# Patient Record
Sex: Male | Born: 1935 | Race: White | Hispanic: No | Marital: Married | Smoking: Never smoker
Health system: Southern US, Community
[De-identification: ages and names within clinical notes are randomized; demographics above are authoritative.]

## PROBLEM LIST (undated history)

## (undated) DIAGNOSIS — N529 Male erectile dysfunction, unspecified: Secondary | ICD-10-CM

## (undated) DIAGNOSIS — H269 Unspecified cataract: Secondary | ICD-10-CM

## (undated) DIAGNOSIS — R7302 Impaired glucose tolerance (oral): Secondary | ICD-10-CM

## (undated) DIAGNOSIS — G43909 Migraine, unspecified, not intractable, without status migrainosus: Secondary | ICD-10-CM

## (undated) DIAGNOSIS — K635 Polyp of colon: Secondary | ICD-10-CM

## (undated) DIAGNOSIS — I4510 Unspecified right bundle-branch block: Secondary | ICD-10-CM

## (undated) DIAGNOSIS — E039 Hypothyroidism, unspecified: Secondary | ICD-10-CM

## (undated) DIAGNOSIS — N200 Calculus of kidney: Secondary | ICD-10-CM

## (undated) DIAGNOSIS — R972 Elevated prostate specific antigen [PSA]: Secondary | ICD-10-CM

## (undated) HISTORY — DX: Calculus of kidney: N20.0

## (undated) HISTORY — DX: Impaired glucose tolerance (oral): R73.02

## (undated) HISTORY — DX: Elevated prostate specific antigen (PSA): R97.20

## (undated) HISTORY — DX: Unspecified right bundle-branch block: I45.10

## (undated) HISTORY — DX: Migraine, unspecified, not intractable, without status migrainosus: G43.909

## (undated) HISTORY — DX: Polyp of colon: K63.5

## (undated) HISTORY — PX: REFRACTIVE SURGERY: SHX103

## (undated) HISTORY — DX: Hypothyroidism, unspecified: E03.9

## (undated) HISTORY — PX: OTHER SURGICAL HISTORY: SHX169

## (undated) HISTORY — DX: Unspecified cataract: H26.9

## (undated) HISTORY — DX: Male erectile dysfunction, unspecified: N52.9

---

## 2005-02-18 ENCOUNTER — Encounter: Admission: RE | Admit: 2005-02-18 | Discharge: 2005-02-18 | Payer: Self-pay | Admitting: Internal Medicine

## 2006-12-22 HISTORY — PX: EYE SURGERY: SHX253

## 2009-12-03 ENCOUNTER — Encounter (INDEPENDENT_AMBULATORY_CARE_PROVIDER_SITE_OTHER): Payer: Self-pay | Admitting: *Deleted

## 2010-01-14 ENCOUNTER — Ambulatory Visit: Payer: Self-pay | Admitting: Internal Medicine

## 2010-01-14 DIAGNOSIS — K648 Other hemorrhoids: Secondary | ICD-10-CM | POA: Insufficient documentation

## 2010-01-14 DIAGNOSIS — D500 Iron deficiency anemia secondary to blood loss (chronic): Secondary | ICD-10-CM | POA: Insufficient documentation

## 2010-01-28 ENCOUNTER — Ambulatory Visit: Payer: Self-pay | Admitting: Internal Medicine

## 2010-01-29 ENCOUNTER — Encounter: Payer: Self-pay | Admitting: Internal Medicine

## 2010-01-30 ENCOUNTER — Telehealth: Payer: Self-pay | Admitting: Internal Medicine

## 2010-01-30 ENCOUNTER — Emergency Department (HOSPITAL_COMMUNITY): Admission: EM | Admit: 2010-01-30 | Discharge: 2010-01-30 | Payer: Self-pay | Admitting: Emergency Medicine

## 2010-02-26 ENCOUNTER — Encounter: Payer: Self-pay | Admitting: Internal Medicine

## 2010-08-29 ENCOUNTER — Encounter: Payer: Self-pay | Admitting: Internal Medicine

## 2011-01-21 NOTE — Letter (Signed)
Summary: [facility] Instructions  [facility]  [address] [place], [place] [address]   Phone: [phone]  Fax: [phone]       GERIK COBERLY    02-25-1935    MRN: [id number]        Procedure Day /Date:MONDAY, 01/28/10     Arrival Time:7:30 AM     Procedure Time:8:30 AM     Location of Procedure:                    X  [facility] (4th Floor)   PREPARATION FOR COLONOSCOPY WITH MOVIPREP   Starting 5 days prior to your procedure 2/2/11do not eat nuts, seeds, popcorn, corn, beans, peas,  salads, or any raw vegetables.  Do not take any fiber supplements (e.g. Metamucil, Citrucel, and Benefiber).  THE DAY BEFORE YOUR PROCEDURE         DATE:01/27/10  ONG:EXBMWU  1.  Drink clear liquids the entire day-NO SOLID FOOD  2.  Do not drink anything colored red or purple.  Avoid juices with pulp.  No orange juice.  3.  Drink at least 64 oz. (8 glasses) of fluid/clear liquids during the day to prevent dehydration and help the prep work efficiently.  CLEAR LIQUIDS INCLUDE: Water Jello Ice Popsicles Tea (sugar ok, no milk/cream) Powdered fruit flavored drinks Coffee (sugar ok, no milk/cream) Gatorade Juice: apple, white grape, white cranberry  Lemonade Clear bullion, consomm, broth Carbonated beverages (any kind) Strained chicken noodle soup Hard Candy                             4.  In the morning, mix first dose of MoviPrep solution:    Empty 1 Pouch A and 1 Pouch B into the disposable container    Add lukewarm drinking water to the top line of the container. Mix to dissolve    Refrigerate (mixed solution should be used within 24 hrs)  5.  Begin drinking the prep at 5:00 p.m. The MoviPrep container is divided by 4 marks.   Every 15 minutes drink the solution down to the next mark (approximately 8 oz) until the full liter is complete.   6.  Follow completed prep with 16 oz of clear liquid of your choice (Nothing red or purple).  Continue to drink  clear liquids until bedtime.  7.  Before going to bed, mix second dose of MoviPrep solution:    Empty 1 Pouch A and 1 Pouch B into the disposable container    Add lukewarm drinking water to the top line of the container. Mix to dissolve    Refrigerate  THE DAY OF YOUR PROCEDURE      DATE: 01/28/10 DAY: MONDAY  Beginning at 3:30 a.m. (5 hours before procedure):         1. Every 15 minutes, drink the solution down to the next mark (approx 8 oz) until the full liter is complete.  2. Follow completed prep with 16 oz. of clear liquid of your choice.    3. You may drink clear liquids until 6:30 AM (2 HOURS BEFORE PROCEDURE).   MEDICATION INSTRUCTIONS  Unless otherwise instructed, you should take regular prescription medications with a small sip of water   as early as possible the morning of your procedure.             OTHER INSTRUCTIONS  You will need a responsible adult at least 74 years of  age to accompany you and drive you home.   This person must remain in the waiting room during your procedure.  Wear loose fitting clothing that is easily removed.  Leave jewelry and other valuables at home.  However, you may wish to bring a book to read or  an iPod/MP3 player to listen to music as you wait for your procedure to start.  Remove all body piercing jewelry and leave at home.  Total time from sign-in until discharge is approximately 2-3 hours.  You should go home directly after your procedure and rest.  You can resume normal activities the  day after your procedure.  The day of your procedure you should not:   Drive   Make legal decisions   Operate machinery   Drink alcohol   Return to work  You will receive specific instructions about eating, activities and medications before you leave.    The above instructions have been reviewed and explained to me by   _______________________    I fully understand and can verbalize these instructions  _____________________________ Date _________

## 2011-01-21 NOTE — Progress Notes (Signed)
Summary: Triage / had a kidney stone  Phone Note Call from Patient Call back at [phone] Cell   Caller: Patient Call For: Dr. Marina Goodell Reason for Call: Talk to Nurse Summary of Call: Pt had a colonoscopy on 01-28-10 and called Dr. Arlyce Dice before the office opened this morning. Was told to go to ER because he had alot of abdominal pain and bloating. Pt. went to ER and is now feeling better. Wanted to give Dr. Marina Goodell an update on his condition. Initial call taken by: Karna Christmas,  January 30, 2010 3:02 PM  Follow-up for Phone Call         Pt. says they found a kidney stone  and he is getting ready to go home from hospital but has not yet passed the stone.Says he has a hx. of kidney stones. Follow-up by: Teryl Lucy RN,  January 30, 2010 3:24 PM  Additional Follow-up for Phone Call Additional follow up Details #1::         I appreciate the update. Good luck with the kidney stone Additional Follow-up by: Hilarie Fredrickson MD,  February 01, 2010 9:46 AM

## 2011-01-21 NOTE — Letter (Signed)
Summary: [facility]  [facility]   Imported By: Lester Cordova 09/04/2010 11:42:45  _____________________________________________________________________  External Attachment:    Type:   Image     Comment:   External Document

## 2011-01-21 NOTE — Letter (Signed)
Summary: [facility]  [facility]   Imported By: Sherian Rein 03/04/2010 07:46:50  _____________________________________________________________________  External Attachment:    Type:   Image     Comment:   External Document

## 2011-01-21 NOTE — Letter (Signed)
Summary: Patient Notice- Polyp Results  [facility]  [address] [place], [place] [address]   Phone: [phone]  Fax: [phone]        January 29, 2010 MRN: [id number]    SHADI SESSLER [address] [place], [place]  [address]    Dear Mr. Hamon,  I am pleased to inform you that the colon polyp(s) removed during your recent colonoscopy was (were) found to be benign (no cancer detected) upon pathologic examination.  I recommend you have a repeat colonoscopy examination in 5 years to look for recurrent polyps, as having colon polyps increases your risk for having recurrent polyps or even colon cancer in the future.  Should you develop new or worsening symptoms of abdominal pain, bowel habit changes or bleeding from the rectum or bowels, please schedule an evaluation with either your primary care physician or with me.  Additional information/recommendations:  __ No further action with gastroenterology is needed at this time. Please      follow-up with your primary care physician for your other healthcare      needs.  Please call us if you are having persistent problems or have questions about your condition that have not been fully answered at this time.  Sincerely,  Hilarie Fredrickson MD  This letter has been electronically signed by your physician.  Appended Document: Patient Notice- Polyp Results Letter mailed 2.10.11

## 2011-01-21 NOTE — Procedures (Signed)
Summary: Colonoscopy  Patient: Jeremy Cortez Note: All result statuses are Final unless otherwise noted.  Tests: (1) Colonoscopy (COL)   COL Colonoscopy           DONE     [facility]     [address]     [place], [place]  [address]           COLONOSCOPY PROCEDURE REPORT           PATIENT:  Jeremy, Cortez  MR#:  [id number]     BIRTHDATE:  14-Apr-1935, 74 yrs. old  GENDER:  male           ENDOSCOPIST:  Wilhemina Bonito. Eda Keys, MD     Referred by:  Chilton Greathouse, M.D.           PROCEDURE DATE:  01/28/2010     PROCEDURE:  Colonoscopy with snare polypectomy     ASA CLASS:  Class II     INDICATIONS:  Routine Risk Screening           MEDICATIONS:   Fentanyl 50 mcg IV, Versed 5 mg IV           DESCRIPTION OF PROCEDURE:   After the risks benefits and     alternatives of the procedure were thoroughly explained, informed     consent was obtained.  Digital rectal exam was performed and     revealed no abnormalities.   The LB CF-H180AL [id number] endoscope     was introduced through the anus and advanced to the cecum, which     was identified by both the appendix and ileocecal valve, without     limitations.Time to cecum = 3:09 min. The quality of the prep was     excellent, using MoviPrep.  The instrument was then slowly     withdrawn (time = 10:57min) as the colon was fully examined.     <<PROCEDUREIMAGES>>           FINDINGS:  A 6mm sessile polyp was found in the cecum. Polyp was     snared without cautery. Retrieval was successful.   Moderate     diverticulosis was found throughout the colon.   Retroflexed views     in the rectum revealed no abnormalities.    The scope was then     withdrawn from the patient and the procedure completed.           COMPLICATIONS:  None           ENDOSCOPIC IMPRESSION:     1) Sessile polyp in the cecum - removed     2) Moderate diverticulosis throughout the colon           RECOMMENDATIONS:     1) Follow up colonoscopy in 5 years        ______________________________     Wilhemina Bonito. Eda Keys, MD           CC:  Chilton Greathouse, MD; the Patient           n.     eSIGNED:   Wilhemina Bonito. Eda Keys at 01/28/2010 09:26 AM           Jonell Cluck, [id number]  Note: An exclamation mark (!) indicates a result that was not dispersed into the flowsheet. Document Creation Date: 01/28/2010 9:26 AM _______________________________________________________________________  (1) Order result status: Final Collection or observation date-time: 01/28/2010 09:19 Requested date-time:  Receipt date-time:  Reported date-time:  Referring Physician:   Ordering  Physician: Fransico Setters [id number]) Specimen Source:  Source: Launa Grill Order Number: [id number] Lab site:   Appended Document: Colonoscopy 5 yr recall     Procedures Next Due Date:    Colonoscopy: 01/2015

## 2011-01-21 NOTE — Assessment & Plan Note (Signed)
Summary: ANEMIC/CH.   History of Present Illness Visit Type: Initial Consult Primary GI MD: Yancey Flemings MD Primary Provider: Lilli Few Requesting Provider: Lilli Few, MD Chief Complaint: Anemia History of Present Illness:   76 year old white male with hypothyroidism, nephrolithiasis, benign prostatic hypertrophy, and impaired glucose tolerance. He is sent today regarding anemia and colonoscopy. The patient tells me that he was diagnosed with "low blood count" last summer. Tells me that he was diagnosed with hypothyroidism and placed on TSH. I have reviewed outside blood work from Dr. Felipa Eth. On November 01, 2009, the hemoglobin was normal at 15.7. Comprehensive metabolic panel was normal as was his thyroid-stimulating hormone and TSH. His hemoglobin A1c was 6.1% . Patient denies any GI symptoms other than occasional problems with hemorrhoids. He has not had prior screening colonoscopy. No family history of colon cancer. Hemoccult studies from November were negative.   GI Review of Systems      Denies abdominal pain, acid reflux, belching, bloating, chest pain, dysphagia with liquids, dysphagia with solids, heartburn, loss of appetite, nausea, vomiting, vomiting blood, weight loss, and  weight gain.      Reports hemorrhoids.     Denies anal fissure, black tarry stools, change in bowel habit, constipation, diarrhea, diverticulosis, fecal incontinence, heme positive stool, irritable bowel syndrome, jaundice, light color stool, liver problems, rectal bleeding, and  rectal pain.    Current Medications (verified): 1)  Synthroid 50 Mcg Tabs (Levothyroxine Sodium) .... Once Daily 2)  Aspir-Low 81 Mg Tbec (Aspirin) .... Once Daily 3)  Fish Oil 1000 Mg Caps (Omega-3 Fatty Acids) .... Two Times A Day 4)  Lecithin 1200 Mg Caps (Lecithin) .... Once Daily 5)  Saw Palmetto 500 Mg Caps (Saw Palmetto (Serenoa Repens)) .... Once Daily  Allergies (verified): No Known Drug Allergies  Past History:  Past  Medical History: Reviewed history from 01/10/2010 and no changes required. BPH Diabetes Hypothyroidism Kidney Stones Chronic Low Back Pain  Past Surgical History: Reviewed history from 01/10/2010 and no changes required. Eye Surgery  Family History: MS-Father BPH Prostate Cancer-brother Family History of Heart Disease: Both Parents No FH of Colon Cancer:  Social History: Reviewed history from 01/10/2010 and no changes required. Married 2 daughters  1 son Retired Patient has never smoked.  Alcohol Use - no Illicit Drug Use - no  Review of Systems       back pain and hearing problems. All other systems reviewed and reported as negative.  Vital Signs:  Patient profile:   76 year old male Height:      71 inches Weight:      195.38 pounds BMI:     27.35 Pulse rate:   76 / minute Pulse rhythm:   regular BP sitting:   110 / 70  (left arm) Cuff size:   regular  Vitals Entered By: June McMurray CMA Duncan Dull) (January 14, 2010 9:25 AM)  Physical Exam  General:  Well developed, well nourished, no acute distress. Head:  Normocephalic and atraumatic. Eyes:  PERRLA, no icterus. Nose:  No deformity, discharge,  or lesions. Mouth:  No deformity or lesions, . Neck:  Supple; no masses or thyromegaly. Lungs:  Clear throughout to auscultation. Heart:  Regular rate and rhythm; no murmurs, rubs,  or bruits. Abdomen:  Soft, nontender and nondistended. No masses, hepatosplenomegaly or hernias noted. Normal bowel sounds. Rectal:  deferred until colonoscopy Prostate:  deferred Msk:  Symmetrical with no gross deformities. Normal posture. Pulses:  Normal pulses noted. Extremities:  No clubbing, cyanosis, edema  or deformities noted. Neurologic:  Alert and  oriented x4;  grossly normal neurologically. Skin:  Intact without significant lesions or rashes. Psych:  Alert and cooperative. Normal mood and affect.   Impression & Recommendations:  Problem # 1:   ANEMIA   (ICD-280.0) Question of anemia. No documentation of such. Most recent hemoglobin normal. Hemoccult negative stool.  Plan: #1. Submit request for additional outside laboratories, if any, substantiating anemia.  Problem # 2:  INTERNAL HEMORRHOIDS WITHOUT MENTION COMP (ICD-455.0) abdomen problems with hemorrhoids. Currently not problematic.  Plan: #1. Hemorrhoid care instructions sheet provided  Problem # 3:  SCREENING COLORECTAL-CANCER (ICD-V76.51) the patient is an appropriate candidate for screening colonoscopy without contraindication. The nature procedure as well as risks, benefits, and alternatives were reviewed. He understood and agreed to proceed. Movi prep prescribed. The patient instructed on its use  Other Orders: Colonoscopy (Colon)  Patient Instructions: 1)  Colonoscopy [facility] 01/28/10 8:30 am 2)  Movi prep instructions given to patient. 3)  Movi prep Rx. sent to pharmacy. 4)  Colonoscopy and Flexible Sigmoidoscopy brochure given.  5)  Upper Endoscopy brochure given.  6)  The medication list was reviewed and reconciled.  All changed / newly prescribed medications were explained.  A complete medication list was provided to the patient / caregiver. 7)  Copy: Dr. Larina Earthly Prescriptions: MOVIPREP 100 GM  SOLR (PEG-KCL-NACL-NASULF-NA ASC-C) As per prep instructions.  #1 x 0   Entered by:   Milford Cage NCMA   Authorized by:   Hilarie Fredrickson MD   Signed by:   Milford Cage NCMA on 01/14/2010   Method used:   Electronically to        [facility] [phone]* (retail)       [address]       [place], [place]  [id number]       Ph: [phone] or [phone]       Fax: [phone]   RxID:   [id number]

## 2011-03-13 LAB — CBC
Hemoglobin: 16 g/dL (ref 13.0–17.0)
MCHC: 34.6 g/dL (ref 30.0–36.0)
MCV: 95.6 fL (ref 78.0–100.0)
RDW: 12.8 % (ref 11.5–15.5)

## 2011-03-13 LAB — DIFFERENTIAL
Lymphocytes Relative: 7 % — ABNORMAL LOW (ref 12–46)
Lymphs Abs: 0.8 10*3/uL (ref 0.7–4.0)
Monocytes Relative: 3 % (ref 3–12)
Neutro Abs: 10.5 10*3/uL — ABNORMAL HIGH (ref 1.7–7.7)
Neutrophils Relative %: 90 % — ABNORMAL HIGH (ref 43–77)

## 2011-03-13 LAB — URINALYSIS, ROUTINE W REFLEX MICROSCOPIC
Bilirubin Urine: NEGATIVE
Nitrite: NEGATIVE
Urobilinogen, UA: 0.2 mg/dL (ref 0.0–1.0)
pH: 5.5 (ref 5.0–8.0)

## 2011-03-13 LAB — COMPREHENSIVE METABOLIC PANEL
BUN: 15 mg/dL (ref 6–23)
Calcium: 9.6 mg/dL (ref 8.4–10.5)
Creatinine, Ser: 1.41 mg/dL (ref 0.4–1.5)
GFR calc non Af Amer: 49 mL/min — ABNORMAL LOW (ref >60)
Glucose, Bld: 187 mg/dL — ABNORMAL HIGH (ref 70–99)
Sodium: 141 mEq/L (ref 135–145)
Total Protein: 6.6 g/dL (ref 6.0–8.3)

## 2011-03-13 LAB — LIPASE, BLOOD: Lipase: 21 U/L (ref 11–59)

## 2011-03-13 LAB — URINE MICROSCOPIC-ADD ON

## 2011-03-13 LAB — URINE CULTURE

## 2011-03-13 LAB — PROTIME-INR
INR: 1.1 (ref 0.00–1.49)
Prothrombin Time: 14.1 seconds (ref 11.6–15.2)

## 2011-03-13 LAB — LACTIC ACID, PLASMA: Lactic Acid, Venous: 2 mmol/L (ref 0.5–2.2)

## 2011-05-06 NOTE — Assessment & Plan Note (Signed)
[facility]                                 ON-CALL NOTE   [name], [name]                        MRN:          [id number]  DATE:01/30/2010                            DOB:          1935-04-02    [name] called approximately 4 hours ago complaining of abdominal  pain and bloating.  He had the inability to pass gas and felt like these  were gas pains.  He had a colonoscopy 2 days before.  He felt perfectly  well until this time.  He was instructed to try to walk about and to  call back if his symptoms did not rapidly subside.  I received a second  call several minutes ago stating that he continues to be bloated and has  abdominal discomfort.  He was instructed to go to the emergency room for  evaluation.     Barbette Hair. Arlyce Dice, MD,FACG     RDK/MedQ  DD: 01/30/2010  DT: 01/30/2010  Job #: [id number]   cc:   Wilhemina Bonito. Marina Goodell, MD

## 2012-12-02 ENCOUNTER — Ambulatory Visit (HOSPITAL_COMMUNITY): Payer: Medicare Other | Attending: Internal Medicine | Admitting: Radiology

## 2012-12-02 ENCOUNTER — Other Ambulatory Visit (HOSPITAL_COMMUNITY): Payer: Self-pay | Admitting: Radiology

## 2012-12-02 DIAGNOSIS — I4510 Unspecified right bundle-branch block: Secondary | ICD-10-CM

## 2012-12-02 DIAGNOSIS — I452 Bifascicular block: Secondary | ICD-10-CM | POA: Insufficient documentation

## 2012-12-02 DIAGNOSIS — I359 Nonrheumatic aortic valve disorder, unspecified: Secondary | ICD-10-CM

## 2012-12-02 DIAGNOSIS — I080 Rheumatic disorders of both mitral and aortic valves: Secondary | ICD-10-CM | POA: Insufficient documentation

## 2012-12-02 DIAGNOSIS — I369 Nonrheumatic tricuspid valve disorder, unspecified: Secondary | ICD-10-CM | POA: Insufficient documentation

## 2012-12-02 NOTE — Progress Notes (Signed)
Echocardiogram performed.  

## 2012-12-03 ENCOUNTER — Encounter (HOSPITAL_COMMUNITY): Payer: Self-pay | Admitting: Internal Medicine

## 2013-01-13 ENCOUNTER — Ambulatory Visit (INDEPENDENT_AMBULATORY_CARE_PROVIDER_SITE_OTHER): Payer: Medicare Other | Admitting: Internal Medicine

## 2013-01-13 ENCOUNTER — Encounter: Payer: Self-pay | Admitting: Internal Medicine

## 2013-01-13 VITALS — BP 116/68 | HR 70 | Ht 71.0 in | Wt 188.0 lb

## 2013-01-13 DIAGNOSIS — R1032 Left lower quadrant pain: Secondary | ICD-10-CM

## 2013-01-13 DIAGNOSIS — K5730 Diverticulosis of large intestine without perforation or abscess without bleeding: Secondary | ICD-10-CM

## 2013-01-13 DIAGNOSIS — Z86010 Personal history of colonic polyps: Secondary | ICD-10-CM

## 2013-01-13 DIAGNOSIS — R195 Other fecal abnormalities: Secondary | ICD-10-CM

## 2013-01-13 MED ORDER — MOVIPREP 100 G PO SOLR: 1.0000 | Freq: Once | ORAL | Status: DC

## 2013-01-13 NOTE — Patient Instructions (Addendum)
You have been scheduled for a endoscopy and colonoscopy with propofol. Please follow written instructions given to you at your visit today.  Please pick up your prep kit at the pharmacy within the next 1-3 days. If you use inhalers (even only as needed) or a CPAP machine, please bring them with you on the day of your procedure.

## 2013-01-13 NOTE — Progress Notes (Signed)
HISTORY OF PRESENT ILLNESS:  Jeremy Cortez is a 78 y.o. male with multiple medical problems as listed below. Patient is referred today regarding Hemoccult-positive stool. He was last seen in the office January 2011 for undocumented anemia with Hemoccult negative stool and the need for colonoscopy. He had not had prior colonoscopy screening. The examination was performed 01/28/2010. He was found to have a 6 mm sessile cecal polyp which was removed and found to be adenomatous. As well moderate pandiverticulosis. Routine followup in 5 years recommended. The patient underwent annual evaluation with his primary provider, Dr. Felipa Eth, last month. Hemoccult testing returned positive. His hemoglobin was normal at 16.4. Repeat Hemoccult study 2 weeks ago was again positive. Patient has been using NSAIDs for back pain, in addition to his daily aspirin. He denies melena or hematochezia. His GI review of systems is however remarkable for 3 weeks of left lower quadrant pain in December. No fever. No change in bowel habits. No problems for the past 7-10 days. He thinks that the discomfort is exacerbated by meds. Cannot identify any factor is that either relieved or exacerbated the pain. He has had this intermittently over the years  REVIEW OF SYSTEMS:  All non-GI ROS negative except for back pain and cough  Past Medical History  Diagnosis Date  . Elevated prostate specific antigen (PSA)   . Impaired glucose tolerance   . Migraine   . Nephrolithiasis   . Erectile dysfunction   . Abnormal PSA   . Bundle branch block, right   . Colon polyps     adenomatous  . Hypothyroidism   . Cataract     Past Surgical History  Procedure Date  . Eye surgery 2008  . Cataract surgery   . Refractive surgery     01/13/2013    Social History [name]  reports that he has never smoked. He has never used smokeless tobacco. He reports that he drinks alcohol. He reports that he does not use illicit drugs.  family  history includes Benign prostatic hyperplasia in his brother; CAD in his brother; Muscular dystrophy in his father; Peripheral vascular disease in his brother; Prostate cancer in his brother; and Ulcers in his father.  No Known Allergies     PHYSICAL EXAMINATION: Vital signs: BP 116/68  Pulse 70  Ht 5\' 11"  (1.803 m)  Wt 188 lb (85.276 kg)  BMI 26.22 kg/m2  Constitutional: generally well-appearing, no acute distress Psychiatric: alert and oriented x3, cooperative Eyes: extraocular movements intact, anicteric, conjunctiva pink Mouth: oral pharynx moist, no lesions Neck: supple no lymphadenopathy Cardiovascular: heart regular rate and rhythm, no murmur Lungs: clear to auscultation bilaterally Abdomen: soft, nontender, nondistended, no obvious ascites, no peritoneal signs, normal bowel sounds, no organomegaly Rectal: Deferred until colonoscopy Extremities: no lower extremity edema bilaterally Skin: no lesions on visible extremities Neuro: No focal deficits.   ASSESSMENT:  #1. Hemoccult-positive stool. Normal hemoglobin. Recent NSAID use. Chronic aspirin use #2. Recent problems with left lower quadrant discomfort. Question diverticulitis. Currently asymptomatic benign exam #3. History of adenomatous colon polyps 3 years ago   PLAN:  #1. Colonoscopy.The nature of the procedure, as well as the risks, benefits, and alternatives were carefully and thoroughly reviewed with the patient. Ample time for discussion and questions allowed. The patient understood, was satisfied, and agreed to proceed.  #2. Diagnostic upper endoscopy.The nature of the procedure, as well as the risks, benefits, and alternatives were carefully and thoroughly reviewed with the patient. Ample time for discussion and  questions allowed. The patient understood, was satisfied, and agreed to proceed.  #3. Avoid NSAIDs #4. Movi prep prescribed #5. Ongoing general medical care with Dr. Felipa Eth

## 2013-01-28 ENCOUNTER — Encounter: Payer: Self-pay | Admitting: Internal Medicine

## 2013-01-28 ENCOUNTER — Ambulatory Visit (AMBULATORY_SURGERY_CENTER): Payer: Medicare Other | Admitting: Internal Medicine

## 2013-01-28 VITALS — BP 135/88 | HR 63 | Temp 98.4°F | Resp 14 | Ht 71.0 in | Wt 188.0 lb

## 2013-01-28 DIAGNOSIS — D126 Benign neoplasm of colon, unspecified: Secondary | ICD-10-CM

## 2013-01-28 DIAGNOSIS — K635 Polyp of colon: Secondary | ICD-10-CM

## 2013-01-28 DIAGNOSIS — Z1211 Encounter for screening for malignant neoplasm of colon: Secondary | ICD-10-CM

## 2013-01-28 DIAGNOSIS — Z860100 Personal history of colon polyps, unspecified: Secondary | ICD-10-CM

## 2013-01-28 DIAGNOSIS — R195 Other fecal abnormalities: Secondary | ICD-10-CM

## 2013-01-28 DIAGNOSIS — K222 Esophageal obstruction: Secondary | ICD-10-CM

## 2013-01-28 DIAGNOSIS — K2980 Duodenitis without bleeding: Secondary | ICD-10-CM

## 2013-01-28 DIAGNOSIS — R1032 Left lower quadrant pain: Secondary | ICD-10-CM

## 2013-01-28 MED ORDER — SODIUM CHLORIDE 0.9 % IV SOLN: 500.0000 mL | INTRAVENOUS | Status: DC

## 2013-01-28 NOTE — Progress Notes (Signed)
Patient examined by Dr. Marina Goodell. Patient given coffee and ambulated to bathroom. Large amount of gas passed, less than 2/10 pain. Patient and wife agree with discharge.

## 2013-01-28 NOTE — Progress Notes (Signed)
Patient with complaints of gas discomfort since admission to Recovery. Patient turned side to side, levsin given and rectal tube inserted. Patient continues to complain of 4/10 with feeling of bloating with some nausea. Dr. Marina Goodell informed by Alease Frame RN, will check patient.

## 2013-01-28 NOTE — Progress Notes (Addendum)
Per pt- "When I lay back, I have a flap in the back of my nose that closes and I can't talk."  Finished drinking 16 oz of Sprite at 1300.  CRNA informed

## 2013-01-28 NOTE — Op Note (Signed)
[facility] [address] [place], [address]   COLONOSCOPY PROCEDURE REPORT  PATIENT: Jeremy Cortez, Jeremy Cortez  MR#: [id number] BIRTHDATE: 1935/09/13 , 77  yrs. old GENDER: Male ENDOSCOPIST: Roxy Cedar, MD REFERRED XB:JYNWGNFAOZ Avva, M.D. PROCEDURE DATE:  01/28/2013 PROCEDURE:   Colonoscopy with snare polypectomy  x 4 ASA CLASS:   Class II INDICATIONS:Patient's personal history of adenomatous colon polyps (01-2010 w/ TA) and heme-positive stool. MEDICATIONS: MAC sedation, administered by CRNA and propofol (Diprivan) 280mg  IV  DESCRIPTION OF PROCEDURE:   After the risks benefits and alternatives of the procedure were thoroughly explained, informed consent was obtained.  A digital rectal exam revealed no abnormalities of the rectum.   The LB CF-H180AL [id number]  endoscope was introduced through the anus and advanced to the cecum, which was identified by both the appendix and ileocecal valve. No adverse events experienced.   The quality of the prep was excellent, using MoviPrep  The instrument was then slowly withdrawn as the colon was fully examined.      COLON FINDINGS: Four polyps ranging between 3-87mm in size were found at the cecum, in the ascending colon, transverse colon, and rectum. A polypectomy was performed with a cold snare.  The resection was complete and the polyp tissue was completely retrieved.   Moderate diverticulosis was noted The finding was in the left colon.   The colon mucosa was otherwise normal.  Retroflexed views revealed internal hemorrhoids. The time to cecum=3 minutes 39 seconds. Withdrawal time=15 minutes 29 seconds.  The scope was withdrawn and the procedure completed. COMPLICATIONS: There were no complications.  ENDOSCOPIC IMPRESSION: 1.   Four polyps ranging between 3-49mm in size were found at the cecum, in the ascending colon, transverse colon, and rectum; polypectomy was performed with a cold snare 2.   Moderate  diverticulosis was noted in the left colon 3.   The colon mucosa was otherwise normal  RECOMMENDATIONS: 1. Repeat Colonoscopy in 3 years (if 3 or more adenomas).   eSigned:  Roxy Cedar, MD 01/28/2013 3:37 PM  cc: Chilton Greathouse, MD and The Patient   PATIENT NAME:  [name], [name] MR#: [id number]

## 2013-01-28 NOTE — Progress Notes (Signed)
Patient did not experience any of the following events: a burn prior to discharge; a fall within the facility; wrong site/side/patient/procedure/implant event; or a hospital transfer or hospital admission upon discharge from the facility. (G8907) Patient did not have preoperative order for IV antibiotic SSI prophylaxis. (G8918)  

## 2013-01-28 NOTE — Progress Notes (Signed)
Called to room to assist during endoscopic procedure.  Patient ID and intended procedure confirmed with present staff. Received instructions for my participation in the procedure from the performing physician.  

## 2013-01-28 NOTE — Op Note (Signed)
[facility] [address] [place], [address]   ENDOSCOPY PROCEDURE REPORT  PATIENT: Cartrell, Bentsen  MR#: [id number] BIRTHDATE: [birth year] , 77  yrs. old GENDER: Male ENDOSCOPIST: Roxy Cedar, MD REFERRED BY:  Chilton Greathouse, M.D. PROCEDURE DATE:  01/28/2013 PROCEDURE:  EGD w/ biopsy ASA CLASS:     Class II INDICATIONS:  Heme positive stool. MEDICATIONS: MAC sedation, administered by CRNA and propofol (Diprivan) 80mg  IV TOPICAL ANESTHETIC: Cetacaine Spray  DESCRIPTION OF PROCEDURE: After the risks benefits and alternatives of the procedure were thoroughly explained, informed consent was obtained.  The LB GIF-H180 [id number] endoscope was introduced through the mouth and advanced to the second portion of the duodenum. Without limitations.  The instrument was slowly withdrawn as the mucosa was fully examined.      The esophagus revealed a large caliber ring at the gastroesophageal junction.  No inflammation.  The stomach revealed a few antral erosions but was otherwise normal.  Retroflexed view revealed a moderate sized hiatal hernia.  Duodenal bulb revealed nonerosive duodenitis, which was mild.  The post bulbar duodenum was normal. CLO biopsy was taken.         The scope was then withdrawn from the patient and the procedure completed.  COMPLICATIONS: There were no complications. ENDOSCOPIC IMPRESSION: 1.  Incidental esophageal ring 2. Mild antral erosions and nonerosive duodenitis  RECOMMENDATIONS: 1.  Avoid unnecessary NSAIDS (anti-inflammatories) 2.  Rx CLO if positive 3. Return to the care of Dr. Felipa Eth  REPEAT EXAM:  eSigned:  Roxy Cedar, MD 01/28/2013 3:43 PM   MW:[id number] Avva, MD and The Patient

## 2013-01-28 NOTE — Patient Instructions (Addendum)
Avoid unnecessary NSAIDS(antiinflamatories-Motrin,Aleve,Ibuprofen etc)   YOU HAD AN ENDOSCOPIC PROCEDURE TODAY AT THE [facility]: Refer to the procedure report that was given to you for any specific questions about what was found during the examination.  If the procedure report does not answer your questions, please call your gastroenterologist to clarify.  If you requested that your care partner not be given the details of your procedure findings, then the procedure report has been included in a sealed envelope for you to review at your convenience later.  YOU SHOULD EXPECT: Some feelings of bloating in the abdomen. Passage of more gas than usual.  Walking can help get rid of the air that was put into your GI tract during the procedure and reduce the bloating. If you had a lower endoscopy (such as a colonoscopy or flexible sigmoidoscopy) you may notice spotting of blood in your stool or on the toilet paper. If you underwent a bowel prep for your procedure, then you may not have a normal bowel movement for a few days.  DIET: Your first meal following the procedure should be a light meal and then it is ok to progress to your normal diet.  A half-sandwich or bowl of soup is an example of a good first meal.  Heavy or fried foods are harder to digest and may make you feel nauseous or bloated.  Likewise meals heavy in dairy and vegetables can cause extra gas to form and this can also increase the bloating.  Drink plenty of fluids but you should avoid alcoholic beverages for 24 hours.  ACTIVITY: Your care partner should take you home directly after the procedure.  You should plan to take it easy, moving slowly for the rest of the day.  You can resume normal activity the day after the procedure however you should NOT DRIVE or use heavy machinery for 24 hours (because of the sedation medicines used during the test).    SYMPTOMS TO REPORT IMMEDIATELY: A gastroenterologist can be reached at any  hour.  During normal business hours, 8:30 AM to 5:00 PM Monday through Friday, call [phone].  After hours and on weekends, please call the GI answering service at [phone] who will take a message and have the physician on call contact you.   Following lower endoscopy (colonoscopy or flexible sigmoidoscopy):  Excessive amounts of blood in the stool  Significant tenderness or worsening of abdominal pains  Swelling of the abdomen that is new, acute  Fever of 100F or higher  Following upper endoscopy (EGD)  Vomiting of blood or coffee ground material  New chest pain or pain under the shoulder blades  Painful or persistently difficult swallowing  New shortness of breath  Fever of 100F or higher  Black, tarry-looking stools  FOLLOW UP: If any biopsies were taken you will be contacted by phone or by letter within the next 1-3 weeks.  Call your gastroenterologist if you have not heard about the biopsies in 3 weeks.  Our staff will call the home number listed on your records the next business day following your procedure to check on you and address any questions or concerns that you may have at that time regarding the information given to you following your procedure. This is a courtesy call and so if there is no answer at the home number and we have not heard from you through the emergency physician on call, we will assume that you have returned to your regular daily activities without incident.  SIGNATURES/CONFIDENTIALITY: You and/or your care partner have signed paperwork which will be entered into your electronic medical record.  These signatures attest to the fact that that the information above on your After Visit Summary has been reviewed and is understood.  Full responsibility of the confidentiality of this discharge information lies with you and/or your care-partner.

## 2013-01-31 ENCOUNTER — Telehealth: Payer: Self-pay | Admitting: *Deleted

## 2013-01-31 NOTE — Telephone Encounter (Signed)
  Follow up Call-  Call back number 01/28/2013  Post procedure Call Back phone  # [phone]  Permission to leave phone message Yes     Patient questions:  Do you have a fever, pain , or abdominal swelling? no Pain Score  0 *  Have you tolerated food without any problems? yes  Have you been able to return to your normal activities? yes  Do you have any questions about your discharge instructions: Diet   no Medications  no Follow up visit  no  Do you have questions or concerns about your Care? no  Actions: * If pain score is 4 or above: No action needed, pain <4.

## 2013-01-31 NOTE — Addendum Note (Signed)
Addended by: Darrel Hoover on: 01/31/2013 10:31 AM   Modules accepted: Orders

## 2013-02-01 ENCOUNTER — Encounter: Payer: Self-pay | Admitting: Internal Medicine

## 2015-10-26 ENCOUNTER — Encounter: Payer: Self-pay | Admitting: Podiatry

## 2015-10-26 ENCOUNTER — Ambulatory Visit (INDEPENDENT_AMBULATORY_CARE_PROVIDER_SITE_OTHER): Payer: Medicare Other

## 2015-10-26 ENCOUNTER — Ambulatory Visit (INDEPENDENT_AMBULATORY_CARE_PROVIDER_SITE_OTHER): Payer: Medicare Other | Admitting: Podiatry

## 2015-10-26 VITALS — BP 134/73 | HR 69 | Resp 12

## 2015-10-26 DIAGNOSIS — M779 Enthesopathy, unspecified: Secondary | ICD-10-CM | POA: Diagnosis not present

## 2015-10-26 DIAGNOSIS — S92302A Fracture of unspecified metatarsal bone(s), left foot, initial encounter for closed fracture: Secondary | ICD-10-CM

## 2015-10-26 NOTE — Progress Notes (Signed)
   Subjective:    Patient ID: Jeremy Cortez, male    DOB: [birth year], 80 y.o.   MRN: [id number]  HPI 80 year old male presents the office today with concerns of pain to the ball of his left foot for which he points along the second metatarsal head. This has been ongoing for the last 2 weeks it has been progressive. He states he has pain with rest and walk. He has been icing without any relief. He describes it as sharp pain. He denies any specific injury or trauma although he does state that he was walking more prior to the onset of pain. He has had on off pain in this area for the last couple years but seems to worsening quite a bit over the last 2 weeks. No other complaints at this time.   Review of Systems  HENT: Positive for hearing loss.        Objective:   Physical Exam General: AAO x3, NAD  Dermatological: Skin is warm, dry and supple bilateral. Nails x 10 are well manicured; remaining integument appears unremarkable at this time. There are no open sores, no preulcerative lesions, no rash or signs of infection present.  Vascular: Dorsalis Pedis artery and Posterior Tibial artery pedal pulses are 2/4 bilateral with immedate capillary fill time. Pedal hair growth present. No varicosities and no lower extremity edema present bilateral. There is no pain with calf compression, swelling, warmth, erythema.   Neruologic: Grossly intact via light touch bilateral. Vibratory intact via tuning fork bilateral. Protective threshold with Semmes Wienstein monofilament intact to all pedal sites bilateral. Patellar and Achilles deep tendon reflexes 2+ bilateral. No Babinski or clonus noted bilateral.   Musculoskeletal: There is tenderness to palpation along the second metatarsal head dorsally and plantarly. There is no pain with MPJ range of motion. No pain along the digits. There is faint edema overlying this area. There is no other areas of tenderness to bilateral lower extremities. There is mild  discomfort with pain within vibratory sensation over second metatarsal on the left foot. No gross boney pedal deformities bilateral. No pain, crepitus, or limitation noted with foot and ankle range of motion bilateral. Muscular strength 5/5 in all groups tested bilateral.  Gait: Unassisted, Nonantalgic.      Assessment & Plan:  80 year old male with possible left second metatarsal head fracture -X-rays were obtained and reviewed with the patient. There is a questionable area of radiolucency in the head of the second metatarsal. Given that this is where he has pinpoint pain and mild swelling recommend immobilization in a surgical shoe which is dispensed today. Ice elevation. Anti-inflammatories as needed. Compression sock. -Follow-up as scheduled or sooner if any problems arise. In the meantime, encouraged to call the office with any questions, concerns, change in symptoms.  *X-ray next appointment   Celesta Gentile

## 2015-10-29 ENCOUNTER — Encounter: Payer: Self-pay | Admitting: Podiatry

## 2015-11-13 ENCOUNTER — Ambulatory Visit (INDEPENDENT_AMBULATORY_CARE_PROVIDER_SITE_OTHER): Payer: Medicare Other | Admitting: Podiatry

## 2015-11-13 ENCOUNTER — Encounter: Payer: Self-pay | Admitting: Podiatry

## 2015-11-13 ENCOUNTER — Ambulatory Visit (INDEPENDENT_AMBULATORY_CARE_PROVIDER_SITE_OTHER): Payer: Medicare Other

## 2015-11-13 VITALS — BP 131/87 | HR 65 | Resp 18

## 2015-11-13 DIAGNOSIS — M779 Enthesopathy, unspecified: Secondary | ICD-10-CM

## 2015-11-13 DIAGNOSIS — R52 Pain, unspecified: Secondary | ICD-10-CM

## 2015-11-19 ENCOUNTER — Encounter: Payer: Self-pay | Admitting: Podiatry

## 2015-11-19 NOTE — Progress Notes (Signed)
Patient ID: Jeremy Cortez, male   DOB: [birth year], 80 y.o.   MRN: [id number]  Subjective: 80 year old male presents the office today for follow-up evaluation of left foot pain and for possible fracture the second metatarsal. He states that since last appointment his pain is significantly improved and he describes an occasional, throbbing pain after he stands for long periods of time but his pain is improved. He denies any swelling or redness. No tingling or numbness. No recent injury or trauma. No other complaints at this time.  Objective: General: AAO x3, NAD  Dermatological: Skin is warm, dry and supple bilateral. Nails x 10 are well manicured; remaining integument appears unremarkable at this time. There are no open sores, no preulcerative lesions, no rash or signs of infection present.  Vascular: Dorsalis Pedis artery and Posterior Tibial artery pedal pulses are 2/4 bilateral with immedate capillary fill time. Pedal hair growth present. No varicosities and no lower extremity edema present bilateral. There is no pain with calf compression, swelling, warmth, erythema.   Neruologic: Grossly intact via light touch bilateral. Vibratory intact via tuning fork bilateral. Protective threshold with Semmes Wienstein monofilament intact to all pedal sites bilateral.  Musculoskeletal: No gross boney pedal deformities bilateral. At this time there is no tenderness with patient and second metatarsal head dorsally or plantarly. There is no  MPJ range of motion. There is prominence the metatarsal heads and atrophy of the fat pad. There is no edema, erythema, increased warmth. There is no pain with vibratory sensation on the metatarsals or the areas of the foot. No pain, crepitus, or limitation noted with foot and ankle range of motion bilateral. Muscular strength 5/5 in all groups tested bilateral.  Gait: Unassisted, Nonantalgic.   Assessment: 80 year old male with resolving left foot pain, likely capsulitis  and fracture unlikely  Plan: -X-rays were obtained and reviewed with the patient. There is no definitive evidence of acute fracture or stress fracture identified this time. -Treatment options discussed including all alternatives, risks, and complications -Etiology of symptoms were discussed -Discussed steroid injection or NSAIDs to help with inflammation, but he states the pain is not bad and he wishes to hold off on this. Dispensed offloading pads. Discuss orthotics and shoe gear modifications. -Follow-up if symptoms recur or worsen. Call questions concerns in the meantime.  Celesta Gentile, DPM

## 2016-01-15 ENCOUNTER — Encounter: Payer: Self-pay | Admitting: Internal Medicine

## 2016-02-26 ENCOUNTER — Encounter: Payer: Self-pay | Admitting: Internal Medicine

## 2017-04-15 ENCOUNTER — Other Ambulatory Visit: Payer: Self-pay | Admitting: Internal Medicine

## 2017-04-15 DIAGNOSIS — R509 Fever, unspecified: Secondary | ICD-10-CM

## 2017-04-15 DIAGNOSIS — R61 Generalized hyperhidrosis: Secondary | ICD-10-CM

## 2017-04-15 DIAGNOSIS — R05 Cough: Secondary | ICD-10-CM

## 2017-04-15 DIAGNOSIS — R059 Cough, unspecified: Secondary | ICD-10-CM

## 2017-04-20 ENCOUNTER — Ambulatory Visit
Admission: RE | Admit: 2017-04-20 | Discharge: 2017-04-20 | Disposition: A | Discharge status: Home / Self Care | Payer: Medicare Other | Source: Ambulatory Visit | Attending: Internal Medicine | Admitting: Internal Medicine

## 2017-04-20 DIAGNOSIS — R059 Cough, unspecified: Secondary | ICD-10-CM

## 2017-04-20 DIAGNOSIS — R509 Fever, unspecified: Secondary | ICD-10-CM

## 2017-04-20 DIAGNOSIS — R61 Generalized hyperhidrosis: Secondary | ICD-10-CM

## 2017-04-20 DIAGNOSIS — R05 Cough: Secondary | ICD-10-CM

## 2017-04-20 MED ORDER — IOPAMIDOL (ISOVUE-300) INJECTION 61%
75.0000 mL | Freq: Once | INTRAVENOUS | Status: AC | PRN
  Administered 2017-04-20: 75 mL via INTRAVENOUS

## 2017-05-15 ENCOUNTER — Ambulatory Visit (INDEPENDENT_AMBULATORY_CARE_PROVIDER_SITE_OTHER): Payer: Medicare Other | Admitting: Pulmonary Disease

## 2017-05-15 ENCOUNTER — Encounter: Payer: Self-pay | Admitting: Pulmonary Disease

## 2017-05-15 VITALS — BP 122/70 | HR 57 | Ht 71.0 in | Wt 183.0 lb

## 2017-05-15 DIAGNOSIS — J189 Pneumonia, unspecified organism: Secondary | ICD-10-CM | POA: Diagnosis not present

## 2017-05-15 NOTE — Patient Instructions (Signed)
It was a pleasure taking care of you today!  You are diagnosed with pneumonia which is significantly improved.  We will get a chest CT scan middle of June.  Follow-up in the office after the chest CT scan (in 4 weeks)  With NP

## 2017-05-15 NOTE — Progress Notes (Signed)
Subjective:    Patient ID: Jeremy Cortez, male    DOB: Jan 29, 1935, 82 y.o.   MRN: [id number]  HPI   This is the case of Jeremy Cortez, 82 y.o. Male, who was referred by Dr. Prince Solian in consultation regarding abnormal chest ct scan.   As you very well know, patient is a non smoker, not known to have copd or asthma, was sick with fevers, cough, SOB in April.  This was triggered as well by the pollen.  He saw his PCP and was given Abx.  He improved but had recurrence of sx so he was placed on a second and third round of abx.  He was not placed on prednisone. He had a chest ct scan just when he was slowly getting better.   He is 100% better. Symptoms in April resolved. He has cough every now and then. (-) fevers, chills. He denies issues swallowing. Denies cough at HS. (-) GERD.    He was exposed to 2nd hand smoke from father.    Review of Systems  Constitutional: Negative for fever and unexpected weight change.  HENT: Positive for congestion and sneezing. Negative for dental problem, ear pain, nosebleeds, postnasal drip, rhinorrhea, sinus pressure, sore throat and trouble swallowing.   Eyes: Negative for redness and itching.  Respiratory: Negative for cough, chest tightness, shortness of breath and wheezing.   Cardiovascular: Negative for palpitations and leg swelling.  Gastrointestinal: Negative for nausea and vomiting.  Genitourinary: Negative for dysuria.  Musculoskeletal: Negative for joint swelling.  Skin: Negative for rash.  Neurological: Negative for headaches.  Hematological: Does not bruise/bleed easily.  Psychiatric/Behavioral: Negative for dysphoric mood. The patient is not nervous/anxious.    Past Medical History:  Diagnosis Date  . Abnormal PSA   . Bundle branch block, right   . Cataract   . Colon polyps    adenomatous  . Elevated prostate specific antigen (PSA)   . Erectile dysfunction   . Hypothyroidism   . Impaired glucose tolerance   . Migraine   .  Nephrolithiasis    (-) CA or DVT  Family History  Problem Relation Age of Onset  . Muscular dystrophy Father   . Ulcers Father   . Benign prostatic hyperplasia Brother   . Prostate cancer Brother   . CAD Brother   . Peripheral vascular disease Brother   . Colon cancer Neg Hx   . Esophageal cancer Neg Hx   . Rectal cancer Neg Hx   . Stomach cancer Neg Hx      Past Surgical History:  Procedure Laterality Date  . cataract surgery    . EYE SURGERY  2008  . REFRACTIVE SURGERY     01/13/2013    Social History   Social History  . Marital status: Married    Spouse name: N/A  . Number of children: 3  . Years of education: N/A   Occupational History  . retired    Social History Main Topics  . Smoking status: Never Smoker  . Smokeless tobacco: Never Used  . Alcohol use Yes     Comment: wine occasionally  . Drug use: No  . Sexual activity: Not on file   Other Topics Concern  . Not on file   Social History Narrative  . No narrative on file  He worked with [employer], office work. Grew up in [place]. Lives in [place].     No Known Allergies   Outpatient Medications Prior to Visit  Medication Sig Dispense Refill  . aspirin 81 MG tablet Take 81 mg by mouth daily.    Marland Kitchen ibuprofen (ADVIL,MOTRIN) 200 MG tablet Take 400 mg by mouth every 6 (six) hours as needed.    . NON FORMULARY Pt started inhaler per Dr. Dagmar Hait- took dose today.  Doesn't know the name of it    . Omega-3 Fatty Acids (FISH OIL PO) Take by mouth daily.    Marland Kitchen levothyroxine (SYNTHROID, LEVOTHROID) 50 MCG tablet Take 50 mcg by mouth daily.    . cetirizine (ZYRTEC) 10 MG tablet Take 10 mg by mouth daily.     No facility-administered medications prior to visit.    Meds ordered this encounter  Medications  . fluticasone (FLONASE) 50 MCG/ACT nasal spray    Sig: Place into both nostrils daily.  Marland Kitchen levothyroxine (SYNTHROID, LEVOTHROID) 75 MCG tablet    Sig: Take 75 mcg by mouth daily before breakfast.           Objective:   Physical Exam   Vitals:  Vitals:   05/15/17 1450  BP: 122/70  Pulse: (!) 57  SpO2: 95%  Weight: 183 lb (83 kg)  Height: 5\' 11"  (1.803 m)    Constitutional/General:  Pleasant, well-nourished, well-developed, not in any distress,  Comfortably seating.  Well kempt  Body mass index is 25.52 kg/m. Wt Readings from Last 3 Encounters:  05/15/17 183 lb (83 kg)  01/28/13 188 lb (85.3 kg)  01/13/13 188 lb (85.3 kg)    HEENT: Pupils equal and reactive to light and accommodation. Anicteric sclerae. Normal nasal mucosa.   No oral  lesions,  mouth clear,  oropharynx clear, no postnasal drip. (-) Oral thrush. No dental caries.  Airway - Mallampati class III  Neck: No masses. Midline trachea. No JVD, (-) LAD. (-) bruits appreciated.  Respiratory/Chest: Grossly normal chest. (-) deformity. (-) Accessory muscle use.  Symmetric expansion. (-) Tenderness on palpation.  Resonant on percussion.  Diminished BS on both lower lung zones. (-) wheezing, crackles, rhonchi (-) egophony  Cardiovascular: Regular rate and  rhythm, heart sounds normal, no murmur or gallops, no peripheral edema  Gastrointestinal:  Normal bowel sounds. Soft, non-tender. No hepatosplenomegaly.  (-) masses.   Musculoskeletal:  Normal muscle tone. Normal gait.   Extremities: Grossly normal. (-) clubbing, cyanosis.  (-) edema  Skin: (-) rash,lesions seen.   Neurological/Psychiatric : alert, oriented to time, place, person. Normal mood and affect         Assessment & Plan:  Pneumonia Patient is a nonsmoker, exposed to significant secondhand smoke from his father, was recently sick and April with fevers, cough, congestion, dyspnea. He was very symptomatic to the point that he ended up having 3 rounds of antibiotics. He eventually improved. He had a chest CT scan done during the time when he was starting to get better. Chest CT scan showed multifocal infiltrates suggesting pneumonia. He had  infiltrates in the right upper lobe, right middle lobe, right lower lobe, left lower lobe. He also had some nonspecific lymphadenopathy.  He helps seeing people with his church. That could be disorders of infection resected. He denies reflux. He has some congestion. Allergies might also make things worse. He denies issues with swallowing.  He is 100% better. He is at his baseline.  He is currently using Flonase, 2 squirts per nostril at bedtime for sinus issues.  Plan to repeat chest CT scan middle of June. That would be 6 weeks from his baseline CAT scan. I think his  CAT scan will be improved, if not resolved. If there are lesions concerning for malignancy, he will need to be worked up with bronchoscopy +/- PET scan. Plan extensively discussed with the patient.  Discussed with him aspiration precautions.     Thank you very much for letting me participate in this patient's care. Please do not hesitate to give me a call if you have any questions or concerns regarding the treatment plan.   Patient will follow up with [place] after the chest ct scan. We need to make sure that this CAT scan is improved, if not resolved.    Monica Becton, MD 05/15/2017   3:48 PM Pulmonary and [place] Pager: [phone] Office: [phone], Fax: [phone]

## 2017-05-15 NOTE — Assessment & Plan Note (Addendum)
Patient is a nonsmoker, exposed to significant secondhand smoke from his father, was recently sick and April with fevers, cough, congestion, dyspnea. He was very symptomatic to the point that he ended up having 3 rounds of antibiotics. He eventually improved. He had a chest CT scan done during the time when he was starting to get better. Chest CT scan showed multifocal infiltrates suggesting pneumonia. He had infiltrates in the right upper lobe, right middle lobe, right lower lobe, left lower lobe. He also had some nonspecific lymphadenopathy.  He helps seeing people with his church. That could be disorders of infection resected. He denies reflux. He has some congestion. Allergies might also make things worse. He denies issues with swallowing.  He is 100% better. He is at his baseline.  He is currently using Flonase, 2 squirts per nostril at bedtime for sinus issues.  Plan to repeat chest CT scan middle of June. That would be 6 weeks from his baseline CAT scan. I think his CAT scan will be improved, if not resolved. If there are lesions concerning for malignancy, he will need to be worked up with bronchoscopy +/- PET scan. Plan extensively discussed with the patient.  Discussed with him aspiration precautions.

## 2017-05-20 ENCOUNTER — Telehealth: Payer: Self-pay | Admitting: Acute Care

## 2017-05-20 NOTE — Telephone Encounter (Signed)
LMTCB

## 2017-05-21 NOTE — Telephone Encounter (Signed)
Spoke with [facility] and she stated she was the one who tried to call the pt. She states she has spoken with pt's wife and she now has the pt's insurance information all straighten out. There is nothing further needed from [place]. Will close this message.,

## 2017-06-04 ENCOUNTER — Ambulatory Visit (INDEPENDENT_AMBULATORY_CARE_PROVIDER_SITE_OTHER)
Admission: RE | Admit: 2017-06-04 | Discharge: 2017-06-04 | Disposition: A | Discharge status: Home / Self Care | Payer: Medicare Other | Source: Ambulatory Visit | Attending: Pulmonary Disease | Admitting: Pulmonary Disease

## 2017-06-04 DIAGNOSIS — J189 Pneumonia, unspecified organism: Secondary | ICD-10-CM

## 2017-06-11 ENCOUNTER — Telehealth: Payer: Self-pay | Admitting: Acute Care

## 2017-06-11 NOTE — Telephone Encounter (Signed)
Pt requesting CT results from 06/04/17. Apt with SG on 06/17/17 has ben rescheduled to 07/15/17. Will send to DOD on 06/12/17, as pt is Dr. Corrie Dandy pt and Clarise Cruz is unavailable. Pt is aware that we will call with results on 06/12/17 and voiced his understanding.  MW please advise. Thanks.

## 2017-06-12 NOTE — Telephone Encounter (Signed)
Spoke with pt. He is aware of his results. Nothing further was needed. 

## 2017-06-12 NOTE — Telephone Encounter (Signed)
CT Much better c/w resolving pna > keep f/u appt as scheduled

## 2017-06-17 ENCOUNTER — Ambulatory Visit: Payer: Medicare Other | Admitting: Acute Care

## 2017-07-15 ENCOUNTER — Ambulatory Visit (INDEPENDENT_AMBULATORY_CARE_PROVIDER_SITE_OTHER): Payer: Medicare Other | Admitting: Acute Care

## 2017-07-15 ENCOUNTER — Encounter: Payer: Self-pay | Admitting: Acute Care

## 2017-07-15 VITALS — BP 118/80 | HR 85 | Ht 71.5 in | Wt 184.0 lb

## 2017-07-15 DIAGNOSIS — J189 Pneumonia, unspecified organism: Secondary | ICD-10-CM

## 2017-07-15 DIAGNOSIS — J181 Lobar pneumonia, unspecified organism: Secondary | ICD-10-CM | POA: Diagnosis not present

## 2017-07-15 NOTE — Patient Instructions (Addendum)
It is great to see you today. I am glad you are feeling better. Continue Flonase as needed for sinus congestion. Follow up as needed. Please contact office for sooner follow up if symptoms do not improve or worsen or seek emergency care

## 2017-07-15 NOTE — Progress Notes (Signed)
History of Present Illness Jeremy Cortez is a 82 y.o. male never smoker with abnormal CT Scan 03/2017. He is followed by Dr. Corrie Dandy.   07/15/2017 Follow Up after CT scan: Pt. Presents for follow up. He states he is doing well.He has no residual upper respiratory symptoms. He has had resolution of his cough. He has no sputum production. He is using Flonase as needed for sinus congestion.He denies fever, chest pain, orthopnea or hemoptysis.   Test Results:  CT Chest w/o contrast 06/04/2017 Near complete resolution of patchy bilateral airspace disease noted on the prior CT scan. There are areas of some residual post pneumonic scarring or atelectasis. No new/acute findings. 2. Stable borderline mediastinal and hilar lymph nodes, some of which are calcified. These are likely reactive. 3. Stable aortic and coronary artery calcifications.  CBC Latest Ref Rng & Units 01/30/2010  WBC 4.0 - 10.5 K/uL 11.7(H)  Hemoglobin 13.0 - 17.0 g/dL 16.0  Hematocrit 39.0 - 52.0 % 46.4  Platelets 150 - 400 K/uL 230    BMP Latest Ref Rng & Units 01/30/2010  Glucose 70 - 99 mg/dL 187(H)  BUN 6 - 23 mg/dL 15  Creatinine 0.4 - 1.5 mg/dL 1.41  Sodium 135 - 145 mEq/L 141  Potassium 3.5 - 5.1 mEq/L 3.8  Chloride 96 - 112 mEq/L 109  CO2 19 - 32 mEq/L 24  Calcium 8.4 - 10.5 mg/dL 9.6     Past medical hx Past Medical History:  Diagnosis Date  . Abnormal PSA   . Bundle branch block, right   . Cataract   . Colon polyps    adenomatous  . Elevated prostate specific antigen (PSA)   . Erectile dysfunction   . Hypothyroidism   . Impaired glucose tolerance   . Migraine   . Nephrolithiasis      Social History  Substance Use Topics  . Smoking status: Never Smoker  . Smokeless tobacco: Never Used  . Alcohol use Yes     Comment: wine occasionally    Tobacco Cessation: Never smoker Second Hand smoke as child  Past surgical hx, Family hx, Social hx all reviewed.  Current Outpatient Prescriptions  on File Prior to Visit  Medication Sig  . aspirin 81 MG tablet Take 81 mg by mouth daily.  . cetirizine (ZYRTEC) 10 MG tablet Take 10 mg by mouth daily.  . fluticasone (FLONASE) 50 MCG/ACT nasal spray Place into both nostrils daily.  Marland Kitchen ibuprofen (ADVIL,MOTRIN) 200 MG tablet Take 400 mg by mouth every 6 (six) hours as needed.  Marland Kitchen levothyroxine (SYNTHROID, LEVOTHROID) 75 MCG tablet Take 75 mcg by mouth daily before breakfast.  . NON FORMULARY Pt started inhaler per Dr. Dagmar Hait- took dose today.  Doesn't know the name of it  . Omega-3 Fatty Acids (FISH OIL PO) Take by mouth daily.   No current facility-administered medications on file prior to visit.      No Known Allergies  Review Of Systems:  Constitutional:   No  weight loss, night sweats,  Fevers, chills, fatigue, or  lassitude.  HEENT:   No headaches,  Difficulty swallowing,  Tooth/dental problems, or  Sore throat,                No sneezing, itching, ear ache, nasal congestion, post nasal drip,   CV:  No chest pain,  Orthopnea, PND, swelling in lower extremities, anasarca, dizziness, palpitations, syncope.   GI  No heartburn, indigestion, abdominal pain, nausea, vomiting, diarrhea, change in bowel habits,  loss of appetite, bloody stools.   Resp: No shortness of breath with exertion or at rest.  No excess mucus, no productive cough,  No non-productive cough,  No coughing up of blood.  No change in color of mucus.  No wheezing.  No chest wall deformity  Skin: no rash or lesions.  GU: no dysuria, change in color of urine, no urgency or frequency.  No flank pain, no hematuria   MS:  No joint pain or swelling.  No decreased range of motion.  No back pain.  Psych:  No change in mood or affect. No depression or anxiety.  No memory loss.   Vital Signs BP 118/80 (BP Location: Left Arm, Cuff Size: Normal)   Pulse 85   Ht 5' 11.5" (1.816 m)   Wt 184 lb (83.5 kg)   SpO2 94%   BMI 25.31 kg/m    Physical Exam:  General- No distress,   A&Ox3, pleasant ENT: No sinus tenderness, TM clear, pale nasal mucosa, no oral exudate,no post nasal drip, no LAN Cardiac: S1, S2, regular rate and rhythm, no murmur Chest: No wheeze/ rales/ dullness; no accessory muscle use, no nasal flaring, no sternal retractions Abd.: Soft Non-tender, BS + Ext: No clubbing cyanosis, edema Neuro:  normal strength Skin: No rashes, warm and dry Psych: normal mood and behavior   Assessment/Plan  Pneumonia Resolution per CT chest 06/10/2017 Plan Continue aspiration precautions Flu shot in the fall Follow-up as needed Please follow up with any signs or symptoms of an upper respiratory infection/pneumonia Please contact office for sooner follow up if symptoms do not improve or worsen or seek emergency care     Magdalen Spatz, NP 07/15/2017  2:53 PM

## 2017-07-15 NOTE — Assessment & Plan Note (Signed)
Resolution per CT chest 06/10/2017 Plan Continue aspiration precautions Flu shot in the fall Follow-up as needed Please follow up with any signs or symptoms of an upper respiratory infection/pneumonia Please contact office for sooner follow up if symptoms do not improve or worsen or seek emergency care

## 2019-02-17 ENCOUNTER — Observation Stay (HOSPITAL_COMMUNITY)
Admission: EM | Admit: 2019-02-17 | Discharge: 2019-02-19 | Disposition: A | Discharge status: Home / Self Care | Payer: Medicare Other | Attending: Family Medicine | Admitting: Family Medicine

## 2019-02-17 ENCOUNTER — Emergency Department (HOSPITAL_COMMUNITY): Payer: Medicare Other

## 2019-02-17 ENCOUNTER — Encounter (HOSPITAL_COMMUNITY): Payer: Self-pay | Admitting: Emergency Medicine

## 2019-02-17 ENCOUNTER — Other Ambulatory Visit: Payer: Self-pay

## 2019-02-17 DIAGNOSIS — Z7982 Long term (current) use of aspirin: Secondary | ICD-10-CM | POA: Insufficient documentation

## 2019-02-17 DIAGNOSIS — N200 Calculus of kidney: Secondary | ICD-10-CM | POA: Diagnosis not present

## 2019-02-17 DIAGNOSIS — E119 Type 2 diabetes mellitus without complications: Secondary | ICD-10-CM | POA: Insufficient documentation

## 2019-02-17 DIAGNOSIS — K648 Other hemorrhoids: Secondary | ICD-10-CM | POA: Diagnosis not present

## 2019-02-17 DIAGNOSIS — I2089 Other forms of angina pectoris: Secondary | ICD-10-CM | POA: Diagnosis present

## 2019-02-17 DIAGNOSIS — I208 Other forms of angina pectoris: Secondary | ICD-10-CM | POA: Diagnosis present

## 2019-02-17 DIAGNOSIS — I4510 Unspecified right bundle-branch block: Secondary | ICD-10-CM | POA: Diagnosis not present

## 2019-02-17 DIAGNOSIS — I25110 Atherosclerotic heart disease of native coronary artery with unstable angina pectoris: Secondary | ICD-10-CM | POA: Diagnosis not present

## 2019-02-17 DIAGNOSIS — R079 Chest pain, unspecified: Secondary | ICD-10-CM

## 2019-02-17 DIAGNOSIS — E039 Hypothyroidism, unspecified: Secondary | ICD-10-CM

## 2019-02-17 DIAGNOSIS — Z79890 Hormone replacement therapy (postmenopausal): Secondary | ICD-10-CM | POA: Diagnosis not present

## 2019-02-17 DIAGNOSIS — D500 Iron deficiency anemia secondary to blood loss (chronic): Secondary | ICD-10-CM

## 2019-02-17 DIAGNOSIS — E785 Hyperlipidemia, unspecified: Secondary | ICD-10-CM

## 2019-02-17 DIAGNOSIS — R001 Bradycardia, unspecified: Secondary | ICD-10-CM

## 2019-02-17 DIAGNOSIS — N400 Enlarged prostate without lower urinary tract symptoms: Secondary | ICD-10-CM | POA: Diagnosis not present

## 2019-02-17 DIAGNOSIS — E7800 Pure hypercholesterolemia, unspecified: Secondary | ICD-10-CM

## 2019-02-17 DIAGNOSIS — Z79899 Other long term (current) drug therapy: Secondary | ICD-10-CM | POA: Diagnosis not present

## 2019-02-17 DIAGNOSIS — R9389 Abnormal findings on diagnostic imaging of other specified body structures: Secondary | ICD-10-CM

## 2019-02-17 LAB — BASIC METABOLIC PANEL
Anion gap: 8 (ref 5–15)
BUN: 17 mg/dL (ref 8–23)
CALCIUM: 9 mg/dL (ref 8.9–10.3)
CHLORIDE: 105 mmol/L (ref 98–111)
CO2: 26 mmol/L (ref 22–32)
CREATININE: 0.92 mg/dL (ref 0.61–1.24)
GFR calc non Af Amer: >60 mL/min (ref >60)
Glucose, Bld: 194 mg/dL — ABNORMAL HIGH (ref 70–99)
Potassium: 4 mmol/L (ref 3.5–5.1)
Sodium: 139 mmol/L (ref 135–145)

## 2019-02-17 LAB — I-STAT TROPONIN, ED: TROPONIN I, POC: 0 ng/mL (ref 0.00–0.08)

## 2019-02-17 LAB — CBC
HEMATOCRIT: 46 % (ref 39.0–52.0)
Hemoglobin: 15.2 g/dL (ref 13.0–17.0)
MCH: 31.5 pg (ref 26.0–34.0)
MCHC: 33 g/dL (ref 30.0–36.0)
MCV: 95.4 fL (ref 80.0–100.0)
NRBC: 0 % (ref 0.0–0.2)
PLATELETS: 184 10*3/uL (ref 150–400)
RBC: 4.82 MIL/uL (ref 4.22–5.81)
RDW: 12.2 % (ref 11.5–15.5)
WBC: 7.6 10*3/uL (ref 4.0–10.5)

## 2019-02-17 LAB — TROPONIN I: Troponin I: <0.03 ng/mL (ref <0.03)

## 2019-02-17 MED ORDER — NITROGLYCERIN 0.4 MG SL SUBL: 0.4000 mg | SUBLINGUAL_TABLET | SUBLINGUAL | Status: DC | PRN

## 2019-02-17 MED ORDER — ACETAMINOPHEN 325 MG PO TABS: 650.0000 mg | ORAL_TABLET | ORAL | Status: DC | PRN

## 2019-02-17 MED ORDER — ATORVASTATIN CALCIUM 40 MG PO TABS: 40.0000 mg | ORAL_TABLET | Freq: Every day | ORAL | Status: DC

## 2019-02-17 MED ORDER — ONDANSETRON HCL 4 MG/2ML IJ SOLN: 4.0000 mg | Freq: Four times a day (QID) | INTRAMUSCULAR | Status: DC | PRN

## 2019-02-17 MED ORDER — ENOXAPARIN SODIUM 40 MG/0.4ML ~~LOC~~ SOLN
40.0000 mg | SUBCUTANEOUS | Status: DC
  Administered 2019-02-17: 40 mg via SUBCUTANEOUS
  Filled 2019-02-17 (×2): qty 0.4

## 2019-02-17 MED ORDER — SODIUM CHLORIDE 0.9% FLUSH
3.0000 mL | Freq: Once | INTRAVENOUS | Status: AC
  Administered 2019-02-17: 3 mL via INTRAVENOUS

## 2019-02-17 NOTE — H&P (Signed)
History and Physical    Jeremy Cortez WJX:[id number] DOB: 1935/01/03 DOA: 02/17/2019  PCP: Prince Solian, MD Patient coming from: Home  Chief Complaint: Chest pain, shortness of breath  HPI: Jeremy Cortez is a 84 y.o. male with medical history significant of right bundle branch block, hypothyroidism, hyperlipidemia presenting to the hospital with complaints of 2 episodes of chest pressure and shortness of breath in the past 3 weeks.  States about 3 weeks ago he was at a stadium climbing a long run of steps when he experienced substernal, nonradiating chest pressure associated with shortness of breath.  Symptoms resolved in a few minutes after he rested.  Again yesterday, over 24 hours ago from now he had a similar episode.  This time again he was climbing stairs and experienced substernal, nonradiating chest pressure associated with shortness of breath.  Again symptoms resolved in a few minutes after he rested.  No chest pain at present.  Denies history of smoking.  Reports family hx of CAD -father, mother, and brother.  States his cardiologist is Dr. Tamala Julian and he was told he had a right bundle branch block several years ago.  He has never had a stress test or cardiac catheterization done before.  Review of Systems: As per HPI otherwise 10 point review of systems negative.  Past Medical History:  Diagnosis Date  . Abnormal PSA   . Bundle branch block, right   . Cataract   . Colon polyps    adenomatous  . Elevated prostate specific antigen (PSA)   . Erectile dysfunction   . Hypothyroidism   . Impaired glucose tolerance   . Migraine   . Nephrolithiasis     Past Surgical History:  Procedure Laterality Date  . cataract surgery    . EYE SURGERY  2008  . REFRACTIVE SURGERY     01/13/2013     reports that he has never smoked. He has never used smokeless tobacco. He reports current alcohol use. He reports that he does not use drugs.  No Known Allergies  Family History  Problem  Relation Age of Onset  . Muscular dystrophy Father   . Ulcers Father   . Benign prostatic hyperplasia Brother   . Prostate cancer Brother   . CAD Brother   . Peripheral vascular disease Brother   . Colon cancer Neg Hx   . Esophageal cancer Neg Hx   . Rectal cancer Neg Hx   . Stomach cancer Neg Hx     Prior to Admission medications   Medication Sig Start Date End Date Taking? Authorizing Provider  aspirin 81 MG tablet Take 81 mg by mouth daily.    [provider]  cetirizine (ZYRTEC) 10 MG tablet Take 10 mg by mouth daily.    [provider]  fluticasone (FLONASE) 50 MCG/ACT nasal spray Place into both nostrils daily.    [provider]  ibuprofen (ADVIL,MOTRIN) 200 MG tablet Take 400 mg by mouth every 6 (six) hours as needed.    [provider]  levothyroxine (SYNTHROID, LEVOTHROID) 75 MCG tablet Take 75 mcg by mouth daily before breakfast.    [provider]  NON FORMULARY Pt started inhaler per Dr. Dagmar Hait- took dose today.  Doesn't know the name of it    [provider]  Omega-3 Fatty Acids (FISH OIL PO) Take by mouth daily.    [provider]    Physical Exam: Vitals:   02/17/19 2115 02/17/19 2145 02/17/19 2200 02/18/19 0352  BP: Marland Kitchen)  152/73 (!) 161/74 (!) 160/77   Pulse: (!) 56 (!) 57 (!) 53 (!) 51  Resp: (!) 8 16 14 15   Temp:      TempSrc:      SpO2: 100% 100% 100% 99%  Weight:      Height:        Physical Exam  Constitutional: He is oriented to person, place, and time. He appears well-developed and well-nourished. No distress.  HENT:  Head: Normocephalic.  Mouth/Throat: Oropharynx is clear and moist.  Eyes: Right eye exhibits no discharge. Left eye exhibits no discharge.  Neck: Neck supple.  Cardiovascular: Normal rate, regular rhythm and intact distal pulses.  Pulmonary/Chest: Effort normal and breath sounds normal. No respiratory distress. He has no wheezes. He has no rales.  Abdominal: Soft. Bowel  sounds are normal. He exhibits no distension. There is no abdominal tenderness. There is no guarding.  Musculoskeletal:        General: No edema.  Neurological: He is alert and oriented to person, place, and time.  Skin: Skin is warm and dry. He is not diaphoretic.  Psychiatric: He has a normal mood and affect. His behavior is normal.     Labs on Admission: I have personally reviewed following labs and imaging studies  CBC: Recent Labs  Lab 02/17/19 1704  WBC 7.6  HGB 15.2  HCT 46.0  MCV 95.4  PLT 509   Basic Metabolic Panel: Recent Labs  Lab 02/17/19 1704  NA 139  K 4.0  CL 105  CO2 26  GLUCOSE 194*  BUN 17  CREATININE 0.92  CALCIUM 9.0   GFR: Estimated Creatinine Clearance: 67.1 mL/min (by C-G formula based on SCr of 0.92 mg/dL). Liver Function Tests: No results for input(s): AST, ALT, ALKPHOS, BILITOT, PROT, ALBUMIN in the last 168 hours. No results for input(s): LIPASE, AMYLASE in the last 168 hours. No results for input(s): AMMONIA in the last 168 hours. Coagulation Profile: No results for input(s): INR, PROTIME in the last 168 hours. Cardiac Enzymes: Recent Labs  Lab 02/17/19 2020 02/18/19 0353  TROPONINI <0.03 <0.03   BNP (last 3 results) No results for input(s): PROBNP in the last 8760 hours. HbA1C: No results for input(s): HGBA1C in the last 72 hours. CBG: No results for input(s): GLUCAP in the last 168 hours. Lipid Profile: Recent Labs    02/18/19 0353  CHOL 117  HDL 39*  LDLCALC 59  TRIG 95  CHOLHDL 3.0   Thyroid Function Tests: No results for input(s): TSH, T4TOTAL, FREET4, T3FREE, THYROIDAB in the last 72 hours. Anemia Panel: No results for input(s): VITAMINB12, FOLATE, FERRITIN, TIBC, IRON, RETICCTPCT in the last 72 hours. Urine analysis:    Component Value Date/Time   COLORURINE YELLOW 01/30/2010 [place] 01/30/2010 0857   LABSPEC 1.026 01/30/2010 0857   PHURINE 5.5 01/30/2010 0857   GLUCOSEU 250 (A)  01/30/2010 0857   HGBUR LARGE (A) 01/30/2010 0857   BILIRUBINUR NEGATIVE 01/30/2010 0857   KETONESUR TRACE (A) 01/30/2010 0857   PROTEINUR NEGATIVE 01/30/2010 0857   UROBILINOGEN 0.2 01/30/2010 0857   NITRITE NEGATIVE 01/30/2010 0857   LEUKOCYTESUR NEGATIVE 01/30/2010 0857    Radiological Exams on Admission: Dg Chest 2 View  Result Date: 02/17/2019 CLINICAL DATA:  Short of breath EXAM: CHEST - 2 VIEW COMPARISON:  06/04/2017 FINDINGS: Normal heart size. Lungs clear. No pneumothorax. No pleural effusion. IMPRESSION: No active cardiopulmonary disease. Electronically Signed   By: Marybelle Killings M.D.   On: 02/17/2019 18:28  EKG: Independently reviewed.  Sinus bradycardia, RBBB.  No prior EKG for comparison.  Assessment/Plan Principal Problem:   Stable angina (HCC) Active Problems:   Sinus bradycardia   HLD (hyperlipidemia)   Hypothyroidism   Stable angina -Risk factors for coronary artery disease include age, hyperlipidemia, and family history. -Troponin x3 negative.  EKG without acute ischemic changes.  RBBB is chronic.  Chest x-ray without acute finding. -Currently chest pain-free. -Cardiac monitoring -Echo -Consult cardiology in the morning -Sublingual nitroglycerin as needed -Continue aspirin -Switch to high intensity statin (Lipitor 40 mg daily).  Check lipid panel -We will hold off starting beta-blocker at this time given bradycardia.  Sinus bradycardia -Not on a beta-blocker.  Check TSH.  Cardiac monitoring.  Hyperlipidemia -Switch to high intensity statin.  Check lipid level.  Hypothyroidism -Continue Synthroid.  Check TSH.  DVT prophylaxis: Lovenox Code Status: Full code.  Discussed with the patient. Family Communication: Wife and daughter at bedside. Disposition Plan: Anticipate discharge in 1 to 2 days. Consults called: None Admission status: Observation, cardiac telemetry   Shela Leff MD [facility] Pager [phone]  If 7PM-7AM,  please contact night-coverage [email or web address] Password [employer] [facility] [employer]  02/18/2019, 5:53 AM

## 2019-02-17 NOTE — ED Triage Notes (Signed)
Pt states he was walking up some stairs last night and started having SOB and chest tightness. Pt states this happened a month ago as well. Pt reports he still has a tightness in the center of his chest.

## 2019-02-17 NOTE — ED Provider Notes (Signed)
[facility] EMERGENCY DEPARTMENT Provider Note   CSN: [id number] Arrival date & time: 02/17/19  1642    History   Chief Complaint Chief Complaint  Patient presents with  . Chest Pain  . Shortness of Breath    HPI Jeremy Cortez is a 84 y.o. male.      Chest Pain  Pain location:  Substernal area Pain quality: pressure   Pain radiates to:  Does not radiate Pain severity:  Moderate Onset quality:  Sudden (With exertion) Timing:  Intermittent Progression:  Resolved Chronicity:  New Context comment:  With exertion Relieved by:  None tried Worsened by:  Nothing Ineffective treatments:  None tried Associated symptoms: shortness of breath   Associated symptoms: no abdominal pain, no back pain, no cough, no fever, no palpitations and no vomiting   Shortness of Breath  Associated symptoms: chest pain   Associated symptoms: no abdominal pain, no cough, no ear pain, no fever, no rash, no sore throat and no vomiting     Past Medical History:  Diagnosis Date  . Abnormal PSA   . Bundle branch block, right   . Cataract   . Colon polyps    adenomatous  . Elevated prostate specific antigen (PSA)   . Erectile dysfunction   . Hypothyroidism   . Impaired glucose tolerance   . Migraine   . Nephrolithiasis     Patient Active Problem List   Diagnosis Date Noted  . Chest pain 02/17/2019  . Stable angina ([place]) 02/17/2019  . Pneumonia 05/15/2017  . IRON DEFICIENCY ANEMIA SECONDARY TO BLOOD LOSS 01/14/2010  . INTERNAL HEMORRHOIDS WITHOUT MENTION COMP 01/14/2010    Past Surgical History:  Procedure Laterality Date  . cataract surgery    . EYE SURGERY  2008  . REFRACTIVE SURGERY     01/13/2013        Home Medications    Prior to Admission medications   Medication Sig Start Date End Date Taking? Authorizing Provider  acetaminophen (TYLENOL) 500 MG tablet Take 500-1,000 mg by mouth every 8 (eight) hours as needed (FOR BACK PAIN).   Yes [provider]  aspirin 81 MG tablet Take 81 mg by mouth at bedtime.    Yes [provider]  Lecithin 1200 MG CAPS Take 2,400 mg by mouth daily.    Yes [provider]  levothyroxine (SYNTHROID, LEVOTHROID) 75 MCG tablet Take 75 mcg by mouth daily before breakfast.   Yes [provider]  Omega-3 Fatty Acids (FISH OIL PO) Take 2 capsules by mouth daily with breakfast.    Yes [provider]  rosuvastatin (CRESTOR) 5 MG tablet Take 5 mg by mouth daily. 12/08/18  Yes [provider]    Family History Family History  Problem Relation Age of Onset  . Muscular dystrophy Father   . Ulcers Father   . Benign prostatic hyperplasia Brother   . Prostate cancer Brother   . CAD Brother   . Peripheral vascular disease Brother   . Colon cancer Neg Hx   . Esophageal cancer Neg Hx   . Rectal cancer Neg Hx   . Stomach cancer Neg Hx     Social History Social History   Tobacco Use  . Smoking status: Never Smoker  . Smokeless tobacco: Never Used  Substance Use Topics  . Alcohol use: Yes    Comment: wine occasionally  . Drug use: No     Allergies   Patient has no known allergies.  Review of Systems Review of Systems  Constitutional: Negative for chills and fever.  HENT: Negative for ear pain and sore throat.   Eyes: Negative for pain and visual disturbance.  Respiratory: Positive for shortness of breath. Negative for cough.   Cardiovascular: Positive for chest pain. Negative for palpitations.  Gastrointestinal: Negative for abdominal pain and vomiting.  Genitourinary: Negative for dysuria and hematuria.  Musculoskeletal: Negative for arthralgias and back pain.  Skin: Negative for color change and rash.  Neurological: Negative for seizures and syncope.  All other systems reviewed and are negative.    Physical Exam Updated Vital Signs BP (!) 160/77   Pulse (!) 53   Temp 97.8 F (36.6 C) (Oral)   Resp 14   Ht 5\' 11"  (1.803 m)   Wt  81.6 kg   SpO2 100%   BMI 25.10 kg/m   Physical Exam Vitals signs and nursing note reviewed.  Constitutional:      Appearance: He is well-developed.     Comments: Patient resting comfortably, no acute distress.  HENT:     Head: Normocephalic and atraumatic.  Eyes:     Conjunctiva/sclera: Conjunctivae normal.  Neck:     Musculoskeletal: Neck supple.  Cardiovascular:     Rate and Rhythm: Normal rate and regular rhythm.     Heart sounds: No murmur.  Pulmonary:     Effort: Pulmonary effort is normal. No respiratory distress.     Breath sounds: Normal breath sounds.  Abdominal:     Palpations: Abdomen is soft.     Tenderness: There is no abdominal tenderness.  Musculoskeletal: Normal range of motion.  Skin:    General: Skin is warm and dry.     Capillary Refill: Capillary refill takes less than 2 seconds.  Neurological:     General: No focal deficit present.     Mental Status: He is alert.  Psychiatric:        Mood and Affect: Mood normal.      ED Treatments / Results  Labs (all labs ordered are listed, but only abnormal results are displayed) Labs Reviewed  BASIC METABOLIC PANEL - Abnormal; Notable for the following components:      Result Value   Glucose, Bld 194 (*)    All other components within normal limits  CBC  TROPONIN I  TROPONIN I  TROPONIN I  LIPID PANEL  I-STAT TROPONIN, ED    EKG EKG Interpretation  Date/Time:  Thursday February 17 2019 16:47:41 EST Ventricular Rate:  59 PR Interval:  170 QRS Duration: 140 QT Interval:  450 QTC Calculation: 445 R Axis:   45 Text Interpretation:  Sinus bradycardia Right bundle branch block Abnormal ECG No old tracing to compare Confirmed by Deno Etienne [id number]) on 02/17/2019 9:18:55 PM   Radiology Dg Chest 2 View  Result Date: 02/17/2019 CLINICAL DATA:  Short of breath EXAM: CHEST - 2 VIEW COMPARISON:  06/04/2017 FINDINGS: Normal heart size. Lungs clear. No pneumothorax. No pleural effusion. IMPRESSION: No  active cardiopulmonary disease. Electronically Signed   By: Marybelle Killings M.D.   On: 02/17/2019 18:28    Procedures Procedures (including critical care time)  Medications Ordered in ED Medications  acetaminophen (TYLENOL) tablet 650 mg (has no administration in time range)  ondansetron (ZOFRAN) injection 4 mg (has no administration in time range)  enoxaparin (LOVENOX) injection 40 mg (40 mg Subcutaneous Given 02/17/19 2313)  atorvastatin (LIPITOR) tablet 40 mg (has no administration in time range)  nitroGLYCERIN (NITROSTAT) SL tablet 0.4 mg (  has no administration in time range)  sodium chloride flush (NS) 0.9 % injection 3 mL (3 mLs Intravenous Given 02/17/19 2135)     Initial Impression / Assessment and Plan / ED Course  I have reviewed the triage vital signs and the nursing notes.  Pertinent labs & imaging results that were available during my care of the patient were reviewed by me and considered in my medical decision making (see chart for details).        84 year old male patient who presents with chest pain on exertion while going upstairs as well as shortness of breath during these episodes.  Patient denies any significant past medical history of cardiac disease besides being on a statin.  Denies any other risk factors such as smoking, diabetes, hypertension.  Patient otherwise denies any other complaints, no infectious-like symptoms.  Physical exam negative for any acute findings, clear lung sounds, good pulses distally.  We will perform work-up for ACS at this time.  Patient non-tachycardic, EKG similar to prior with right bundle branch block, no ST elevation or depression, appropriate T waves and intervals.  Troponin negative here in the emergency department.  Heart score of 4.  Patient has never had any prior stress testing or cardiac evaluation.  Story is consistent with ACS concerning for worsening cardiac disease.  Inpatient team in agreement with admission for cardiac  work-up.  Patient admitted in stable condition with stable vital signs.  Final Clinical Impressions(s) / ED Diagnoses   Final diagnoses:  Chest pain, unspecified type    ED Discharge Orders    None       Orson Aloe, MD 02/18/19 Claude Manges, DO 02/18/19 1505

## 2019-02-18 ENCOUNTER — Encounter (HOSPITAL_COMMUNITY)
Admission: EM | Disposition: A | Discharge status: Home / Self Care | Payer: Self-pay | Source: Home / Self Care | Attending: Internal Medicine

## 2019-02-18 ENCOUNTER — Observation Stay (HOSPITAL_COMMUNITY): Payer: Medicare Other

## 2019-02-18 ENCOUNTER — Encounter (HOSPITAL_COMMUNITY): Payer: Self-pay | Admitting: Cardiology

## 2019-02-18 ENCOUNTER — Observation Stay (HOSPITAL_BASED_OUTPATIENT_CLINIC_OR_DEPARTMENT_OTHER): Payer: Medicare Other

## 2019-02-18 DIAGNOSIS — R9389 Abnormal findings on diagnostic imaging of other specified body structures: Secondary | ICD-10-CM

## 2019-02-18 DIAGNOSIS — R001 Bradycardia, unspecified: Secondary | ICD-10-CM | POA: Diagnosis not present

## 2019-02-18 DIAGNOSIS — I208 Other forms of angina pectoris: Secondary | ICD-10-CM | POA: Diagnosis not present

## 2019-02-18 DIAGNOSIS — E039 Hypothyroidism, unspecified: Secondary | ICD-10-CM

## 2019-02-18 DIAGNOSIS — E7800 Pure hypercholesterolemia, unspecified: Secondary | ICD-10-CM

## 2019-02-18 DIAGNOSIS — I25118 Atherosclerotic heart disease of native coronary artery with other forms of angina pectoris: Secondary | ICD-10-CM

## 2019-02-18 DIAGNOSIS — R079 Chest pain, unspecified: Secondary | ICD-10-CM

## 2019-02-18 DIAGNOSIS — I4510 Unspecified right bundle-branch block: Secondary | ICD-10-CM | POA: Diagnosis not present

## 2019-02-18 DIAGNOSIS — E785 Hyperlipidemia, unspecified: Secondary | ICD-10-CM

## 2019-02-18 HISTORY — PX: LEFT HEART CATH AND CORONARY ANGIOGRAPHY: CATH118249

## 2019-02-18 LAB — TSH: TSH: 3.828 u[IU]/mL (ref 0.350–4.500)

## 2019-02-18 LAB — LIPID PANEL
CHOLESTEROL: 117 mg/dL (ref 0–200)
HDL: 39 mg/dL — AB (ref >40)
LDL Cholesterol: 59 mg/dL (ref 0–99)
TRIGLYCERIDES: 95 mg/dL (ref <150)
Total CHOL/HDL Ratio: 3 RATIO
VLDL: 19 mg/dL (ref 0–40)

## 2019-02-18 LAB — TROPONIN I
Troponin I: <0.03 ng/mL (ref <0.03)
Troponin I: <0.03 ng/mL (ref <0.03)

## 2019-02-18 LAB — ECHOCARDIOGRAM COMPLETE
Height: 71 in
Weight: 2880 oz

## 2019-02-18 SURGERY — LEFT HEART CATH AND CORONARY ANGIOGRAPHY
Anesthesia: LOCAL

## 2019-02-18 MED ORDER — ASPIRIN 81 MG PO CHEW: 81.0000 mg | CHEWABLE_TABLET | Freq: Every day | ORAL | Status: DC

## 2019-02-18 MED ORDER — MIDAZOLAM HCL 2 MG/2ML IJ SOLN
INTRAMUSCULAR | Status: DC | PRN
  Administered 2019-02-18: 1 mg via INTRAVENOUS

## 2019-02-18 MED ORDER — VERAPAMIL HCL 2.5 MG/ML IV SOLN
INTRAVENOUS | Status: AC
  Filled 2019-02-18: qty 2

## 2019-02-18 MED ORDER — LIDOCAINE HCL (PF) 1 % IJ SOLN
INTRAMUSCULAR | Status: AC
  Filled 2019-02-18: qty 30

## 2019-02-18 MED ORDER — HEPARIN (PORCINE) IN NACL 1000-0.9 UT/500ML-% IV SOLN
INTRAVENOUS | Status: DC | PRN
  Administered 2019-02-18 (×2): 500 mL

## 2019-02-18 MED ORDER — NITROGLYCERIN 0.4 MG SL SUBL
0.8000 mg | SUBLINGUAL_TABLET | Freq: Once | SUBLINGUAL | Status: AC
  Administered 2019-02-18 (×2): 0.8 mg via SUBLINGUAL

## 2019-02-18 MED ORDER — ATORVASTATIN CALCIUM 80 MG PO TABS
80.0000 mg | ORAL_TABLET | Freq: Every day | ORAL | Status: DC
  Administered 2019-02-18: 80 mg via ORAL

## 2019-02-18 MED ORDER — MIDAZOLAM HCL 2 MG/2ML IJ SOLN
INTRAMUSCULAR | Status: AC
  Filled 2019-02-18: qty 2

## 2019-02-18 MED ORDER — NITROGLYCERIN 0.4 MG SL SUBL
SUBLINGUAL_TABLET | SUBLINGUAL | Status: AC
  Administered 2019-02-18: 0.8 mg
  Filled 2019-02-18: qty 2

## 2019-02-18 MED ORDER — AMLODIPINE BESYLATE 2.5 MG PO TABS
5.0000 mg | ORAL_TABLET | Freq: Every day | ORAL | Status: DC
  Administered 2019-02-18 – 2019-02-19 (×2): 5 mg via ORAL
  Filled 2019-02-18: qty 1
  Filled 2019-02-18: qty 2

## 2019-02-18 MED ORDER — SODIUM CHLORIDE 0.9 % IV SOLN
INTRAVENOUS | Status: DC
  Administered 2019-02-18: 20:00:00 via INTRAVENOUS

## 2019-02-18 MED ORDER — FENTANYL CITRATE (PF) 100 MCG/2ML IJ SOLN
INTRAMUSCULAR | Status: AC
  Filled 2019-02-18: qty 2

## 2019-02-18 MED ORDER — ONDANSETRON HCL 4 MG/2ML IJ SOLN: 4.0000 mg | Freq: Four times a day (QID) | INTRAMUSCULAR | Status: DC | PRN

## 2019-02-18 MED ORDER — SODIUM CHLORIDE 0.9 % IV SOLN
INTRAVENOUS | Status: DC
  Administered 2019-02-18: 16:00:00 via INTRAVENOUS

## 2019-02-18 MED ORDER — NITROGLYCERIN 0.4 MG SL SUBL
SUBLINGUAL_TABLET | SUBLINGUAL | Status: AC
  Administered 2019-02-18: 0.8 mg via SUBLINGUAL
  Filled 2019-02-18: qty 2

## 2019-02-18 MED ORDER — ASPIRIN 81 MG PO CHEW
81.0000 mg | CHEWABLE_TABLET | Freq: Every day | ORAL | Status: DC
  Administered 2019-02-19: 81 mg via ORAL
  Filled 2019-02-18 (×2): qty 1

## 2019-02-18 MED ORDER — HEPARIN (PORCINE) IN NACL 1000-0.9 UT/500ML-% IV SOLN
INTRAVENOUS | Status: AC
  Filled 2019-02-18: qty 1000

## 2019-02-18 MED ORDER — SODIUM CHLORIDE 0.9 % IV SOLN: 250.0000 mL | INTRAVENOUS | Status: DC | PRN

## 2019-02-18 MED ORDER — FENTANYL CITRATE (PF) 100 MCG/2ML IJ SOLN
INTRAMUSCULAR | Status: DC | PRN
  Administered 2019-02-18: 25 ug via INTRAVENOUS

## 2019-02-18 MED ORDER — HEPARIN SODIUM (PORCINE) 1000 UNIT/ML IJ SOLN
INTRAMUSCULAR | Status: DC | PRN
  Administered 2019-02-18: 4000 [IU] via INTRAVENOUS

## 2019-02-18 MED ORDER — LIDOCAINE HCL (PF) 1 % IJ SOLN
INTRAMUSCULAR | Status: DC | PRN
  Administered 2019-02-18: 2 mL

## 2019-02-18 MED ORDER — SODIUM CHLORIDE 0.9% FLUSH: 3.0000 mL | Freq: Two times a day (BID) | INTRAVENOUS | Status: DC

## 2019-02-18 MED ORDER — IOPAMIDOL (ISOVUE-370) INJECTION 76%
80.0000 mL | Freq: Once | INTRAVENOUS | Status: AC | PRN
  Administered 2019-02-18: 80 mL via INTRAVENOUS

## 2019-02-18 MED ORDER — ACETAMINOPHEN 325 MG PO TABS: 650.0000 mg | ORAL_TABLET | ORAL | Status: DC | PRN

## 2019-02-18 MED ORDER — HEPARIN SODIUM (PORCINE) 1000 UNIT/ML IJ SOLN
INTRAMUSCULAR | Status: AC
  Filled 2019-02-18: qty 1

## 2019-02-18 MED ORDER — VERAPAMIL HCL 2.5 MG/ML IV SOLN
INTRAVENOUS | Status: DC | PRN
  Administered 2019-02-18: 10 mL via INTRA_ARTERIAL

## 2019-02-18 MED ORDER — SODIUM CHLORIDE 0.9% FLUSH: 3.0000 mL | INTRAVENOUS | Status: DC | PRN

## 2019-02-18 MED ORDER — IOHEXOL 350 MG/ML SOLN
INTRAVENOUS | Status: DC | PRN
  Administered 2019-02-18: 70 mL via INTRA_ARTERIAL

## 2019-02-18 MED ORDER — LEVOTHYROXINE SODIUM 75 MCG PO TABS
75.0000 ug | ORAL_TABLET | Freq: Every day | ORAL | Status: DC
  Administered 2019-02-18 – 2019-02-19 (×2): 75 ug via ORAL
  Filled 2019-02-18 (×2): qty 1

## 2019-02-18 MED ORDER — ASPIRIN 81 MG PO CHEW
81.0000 mg | CHEWABLE_TABLET | ORAL | Status: AC
  Administered 2019-02-18: 81 mg via ORAL
  Filled 2019-02-18: qty 1

## 2019-02-18 SURGICAL SUPPLY — 11 items
CATH INFINITI 5FR ANG PIGTAIL (CATHETERS) ×1 IMPLANT
CATH OPTITORQUE TIG 4.0 5F (CATHETERS) ×1 IMPLANT
DEVICE RAD COMP TR BAND LRG (VASCULAR PRODUCTS) ×1 IMPLANT
GLIDESHEATH SLEND SS 6F .021 (SHEATH) ×1 IMPLANT
GUIDEWIRE INQWIRE 1.5J.035X260 (WIRE) IMPLANT
INQWIRE 1.5J .035X260CM (WIRE) ×2
KIT HEART LEFT (KITS) ×2 IMPLANT
PACK CARDIAC CATHETERIZATION (CUSTOM PROCEDURE TRAY) ×2 IMPLANT
SYR MEDRAD MARK 7 150ML (SYRINGE) ×2 IMPLANT
TRANSDUCER W/STOPCOCK (MISCELLANEOUS) ×2 IMPLANT
TUBING CIL FLEX 10 FLL-RA (TUBING) ×2 IMPLANT

## 2019-02-18 NOTE — Progress Notes (Signed)
[facility] PROGRESS NOTE  Jeremy Cortez FBP:[id number] DOB: [birth year] DOA: 02/17/2019 PCP: Prince Solian, MD   Narrative: 84 year old male Non-smoker-probable allergies status post multiple rounds of antibiotics in 2018 referred to pulmonary for may be abnormal CT Hypothyroidism BPH Impaired glucose tolerance Nephrolithiasis Internal hemorrhoids Chronic right bundle branch block  Admitted from home with 3-week history of shortness of breath chest pressure after climbing stadium stairs-describes as substernal, nonradiating pressure  Heart score of 4 ascribed to patient  A & Plan Chest pain, heart score 4  Suspicious for Stable angina--DDX for PE less likely --Not tachy, no hypoxia and occur at rest  Consulted cardiology for eval  Cycle troponin--defer Ischemic work-up at this time to cardiology  Keep npo--Cardiology may place diet order if they feel approp Hyperlipidemia  Now on Atorvastatin 40 Mod intensity  LAbs look great BPH  No meds-some report of + PSA in past--OP screeening Hypothyroidism  TSH wnl-cont synthroid 75 Impaired glucose tolerance  Sugar 194--check A1c as has never been done Sinus bradycardia Chronic right bundle branch block  Could be consitutional-Well conditioned for age--plays gold x 2  per week--keep on tele to r/o other issues Prior nephrolithiasis    DVT   Lovenox code Status: Full code communication:    Disposition Plan: Await cardiology input   Verlon Au, MD  [facility] [address] [place] [email or web address] 7PM-7AM contact night coverage as above 02/18/2019, 8:04 AM  LOS: 0 days   Consultants:  Cardiology  Procedures:  None  Antimicrobials:  No  Interval history/Subjective:  "when will the test be done" "I wanna go home" "Can I move upstairs"  Describes episodes as per HPI of sob and tightness in chest with accustomed activity, none at rest No recent large weight gain No swelling of  extremities  Objective:  Vitals:  Vitals:   02/17/19 2200 02/18/19 0352  BP: (!) 160/77   Pulse: (!) 53 (!) 51  Resp: 14 15  Temp:    SpO2: 100% 99%    Exam:   alert well noursihed CM nad eomi ncat no pallor no ict cta b no adde dsound abd soft nt nd no rebound no guard Neuro intact no focal deficit moves 4 limbs equally-power 5/5, reflexes deferred   I have personally reviewed the following:  DATA   Labs:  Chem-7 within normal limits, hemoglobin and CBC is stable  Troponin less than 0.03, LDL 59, total cholesterol 170  TSH 3.82   Medical tests:  EKG  Test discussed with performing physician:  n  Decision to obtain old records:  n  Review and summation of old records:  yes  Scheduled Meds: . aspirin  81 mg Oral QHS  . atorvastatin  40 mg Oral q1800  . enoxaparin (LOVENOX) injection  40 mg Subcutaneous Q24H  . levothyroxine  75 mcg Oral QAC breakfast   Continuous Infusions:  Principal Problem:   Stable angina (HCC) Active Problems:   Sinus bradycardia   HLD (hyperlipidemia)   Hypothyroidism   LOS: 0 days

## 2019-02-18 NOTE — Interval H&P Note (Signed)
Cath Lab Visit (complete for each Cath Lab visit)  Clinical Evaluation Leading to the Procedure:   ACS: No.  Non-ACS:    Anginal Classification: CCS III  Anti-ischemic medical therapy: No Therapy  Non-Invasive Test Results: Intermediate-risk stress test findings: cardiac mortality 1-3%/year  Prior CABG: No previous CABG      History and Physical Interval Note:  02/18/2019 4:30 PM  Jeremy Cortez  has presented today for surgery, with the diagnosis of UA  The various methods of treatment have been discussed with the patient and family. After consideration of risks, benefits and other options for treatment, the patient has consented to  Procedure(s): LEFT HEART CATH AND CORONARY ANGIOGRAPHY (N/A) as a surgical intervention .  The patient's history has been reviewed, patient examined, no change in status, stable for surgery.  I have reviewed the patient's chart and labs.  Questions were answered to the patient's satisfaction.     [name]

## 2019-02-18 NOTE — ED Notes (Signed)
Patient ambulatory to bathroom with steady gait at this time 

## 2019-02-18 NOTE — Progress Notes (Signed)
*  PRELIMINARY RESULTS* Echocardiogram 2D Echocardiogram has been performed.  02/18/2019, 9:32 AM

## 2019-02-18 NOTE — ED Notes (Signed)
Patient provided with word find and magazine to keep him busy while he waits for a room upstairs.

## 2019-02-18 NOTE — H&P (View-Only) (Signed)
Cardiology Consultation:   Patient ID: Jeremy Cortez MRN: [id number]; DOB: 09-Mar-1935  Admit date: 02/17/2019 Date of Consult: 02/18/2019  Primary Care Provider: Prince Solian, MD Primary Cardiologist: No primary care provider on file. New Primary Electrophysiologist:  None    Patient Profile:   Jeremy Cortez is a 84 y.o. male with a hx of hypothyroidism, RBBB since 2014 otherwise no cardiac hx who is being seen today for the evaluation of chest pain at the request of Dr. Verlon Au.  History of Present Illness:   Jeremy Cortez with only prior cardiac hx of RBBB and hx of normal Echo.  He doses have hx elevated PSA, hypothyroidism.  Presented to ER last pm with dyspnea and chest tightness with walking up the stairs.  This was his second episode.  Both times the chest pressure resolved with rest.   Both times a little more strenuous than he is used to doing. With rest it does go away.   EKG:  The EKG was personally reviewed and demonstrates:  SR with RBBB no old to compare Telemetry:  Telemetry was personally reviewed and demonstrates:  SR RBBB  Troponin 0.00 <0.03 X 2  Na 139 k+ 4.0, BUN 17, Cr 0.92  Hgb 15.2 WBC 7.6 plts 184  TSH 3.828  CXR 2 V : IMPRESSION: No active cardiopulmonary disease.  Echo pending.  No pain currently   Past Medical History:  Diagnosis Date  . Abnormal PSA   . Bundle branch block, right   . Cataract   . Colon polyps    adenomatous  . Elevated prostate specific antigen (PSA)   . Erectile dysfunction   . Hypothyroidism   . Impaired glucose tolerance   . Migraine   . Nephrolithiasis     Past Surgical History:  Procedure Laterality Date  . cataract surgery    . EYE SURGERY  2008  . REFRACTIVE SURGERY     01/13/2013     Home Medications:  Prior to Admission medications   Medication Sig Start Date End Date Taking? Authorizing Provider  acetaminophen (TYLENOL) 500 MG tablet Take 500-1,000 mg by mouth every 8 (eight) hours as needed (FOR  BACK PAIN).   Yes [provider]  aspirin 81 MG tablet Take 81 mg by mouth at bedtime.    Yes [provider]  Lecithin 1200 MG CAPS Take 2,400 mg by mouth daily.    Yes [provider]  levothyroxine (SYNTHROID, LEVOTHROID) 75 MCG tablet Take 75 mcg by mouth daily before breakfast.   Yes [provider]  Omega-3 Fatty Acids (FISH OIL PO) Take 2 capsules by mouth daily with breakfast.    Yes [provider]  rosuvastatin (CRESTOR) 5 MG tablet Take 5 mg by mouth daily. 12/08/18  Yes [provider]    Inpatient Medications: Scheduled Meds: . aspirin  81 mg Oral QHS  . atorvastatin  40 mg Oral q1800  . enoxaparin (LOVENOX) injection  40 mg Subcutaneous Q24H  . levothyroxine  75 mcg Oral QAC breakfast   Continuous Infusions:  PRN Meds: acetaminophen, nitroGLYCERIN, ondansetron (ZOFRAN) IV  Allergies:   No Known Allergies  Social History:   Social History   Socioeconomic History  . Marital status: Married    Spouse name: Not on file  . Number of children: 3  . Years of education: Not on file  . Highest education level: Not on file  Occupational History  . Occupation: retired  Scientific laboratory technician  . Financial resource strain: Not  on file  . Food insecurity:    Worry: Not on file    Inability: Not on file  . Transportation needs:    Medical: Not on file    Non-medical: Not on file  Tobacco Use  . Smoking status: Never Smoker  . Smokeless tobacco: Never Used  Substance and Sexual Activity  . Alcohol use: Yes    Comment: wine occasionally  . Drug use: No  . Sexual activity: Not on file  Lifestyle  . Physical activity:    Days per week: Not on file    Minutes per session: Not on file  . Stress: Not on file  Relationships  . Social connections:    Talks on phone: Not on file    Gets together: Not on file    Attends religious service: Not on file    Active member of club or organization: Not on file    Attends meetings  of clubs or organizations: Not on file    Relationship status: Not on file  . Intimate partner violence:    Fear of current or ex partner: Not on file    Emotionally abused: Not on file    Physically abused: Not on file    Forced sexual activity: Not on file  Other Topics Concern  . Not on file  Social History Narrative  . Not on file    Family History:    Family History  Problem Relation Age of Onset  . Muscular dystrophy Father   . Ulcers Father   . Benign prostatic hyperplasia Brother   . Prostate cancer Brother   . CAD Brother   . Peripheral vascular disease Brother   . Colon cancer Neg Hx   . Esophageal cancer Neg Hx   . Rectal cancer Neg Hx   . Stomach cancer Neg Hx      ROS:  Please see the history of present illness.  General:no colds or fevers, no weight changes Skin:no rashes or ulcers HEENT:no blurred vision, no congestion CV:see HPI PUL:see HPI GI:no diarrhea constipation or melena, no indigestion GU:no hematuria, no dysuria MS:no joint pain, no claudication Neuro:no syncope, no lightheadedness Endo:no diabetes, no thyroid disease  All other ROS reviewed and negative.     Physical Exam/Data:   Vitals:   02/17/19 2145 02/17/19 2200 02/18/19 0352 02/18/19 0804  BP: (!) 161/74 (!) 160/77  138/74  Pulse: (!) 57 (!) 53 (!) 51 (!) 55  Resp: 16 14 15 18   Temp:      TempSrc:      SpO2: 100% 100% 99% 98%  Weight:      Height:       No intake or output data in the 24 hours ending 02/18/19 0932 Last 3 Weights 02/17/2019 07/15/2017 05/15/2017  Weight (lbs) 180 lb 184 lb 183 lb  Weight (kg) 81.647 kg 83.462 kg 83.008 kg     Body mass index is 25.1 kg/m.  General:  Well nourished, well developed, in no acute distress  HEENT: normal Lymph: no adenopathy Neck: no JVD Endocrine:  No thryomegaly Vascular: No carotid bruits; pedal pulses 2+ bilaterally without bruits  Cardiac:  normal S1, S2; RRR; no murmur gallup rub or click Lungs:  clear to auscultation  bilaterally, no wheezing, rhonchi or rales  Abd: soft, nontender, no hepatomegaly  Ext: no edema Musculoskeletal:  No deformities, BUE and BLE strength normal and equal Skin: warm and dry  Neuro: alert and oriented X 3 MAE, follows commands no focal abnormalities  noted Psych:  Normal affect     Relevant CV Studies: No old  Laboratory Data:  Chemistry Recent Labs  Lab 02/17/19 1704  NA 139  K 4.0  CL 105  CO2 26  GLUCOSE 194*  BUN 17  CREATININE 0.92  CALCIUM 9.0  GFRNONAA >60  GFRAA >60  ANIONGAP 8    No results for input(s): PROT, ALBUMIN, AST, ALT, ALKPHOS, BILITOT in the last 168 hours. Hematology Recent Labs  Lab 02/17/19 1704  WBC 7.6  RBC 4.82  HGB 15.2  HCT 46.0  MCV 95.4  MCH 31.5  MCHC 33.0  RDW 12.2  PLT 184   Cardiac Enzymes Recent Labs  Lab 02/17/19 2020 02/18/19 0353  TROPONINI <0.03 <0.03    Recent Labs  Lab 02/17/19 1716  TROPIPOC 0.00    BNPNo results for input(s): BNP, PROBNP in the last 168 hours.  DDimer No results for input(s): DDIMER in the last 168 hours.  Radiology/Studies:  Dg Chest 2 View  Result Date: 02/17/2019 CLINICAL DATA:  Short of breath EXAM: CHEST - 2 VIEW COMPARISON:  06/04/2017 FINDINGS: Normal heart size. Lungs clear. No pneumothorax. No pleural effusion. IMPRESSION: No active cardiopulmonary disease. Electronically Signed   By: Marybelle Killings M.D.   On: 02/17/2019 18:28    Assessment and Plan:   1. Exertional angina has happened twice -  Neg for MI. Dr. Sallyanne Kuster to see.  Will proceed with cardiac CTA to further eval  With FFR.   2. Chronic RBBB 3. HTN on arrival --still elevated at times but HR in the 50s so will not add BB, BP comes down at times. Will monitor.  4. HLD on statin crestor 5 mg T chol 117, HDL 39 and LDL 59       For questions or updates, please contact [place] Please consult [email or web address] for contact info under     Signed, Cecilie Kicks, NP  02/18/2019 9:32 AM   I have seen  and examined the patient along with Cecilie Kicks, NP .  I have reviewed the chart, notes and new data.  I agree with PA/NP's note.  Key new complaints: describes typical exertional angina pectoris ("unstable" due to recent onset, but clearly related to greater than usual activity), uses Levine sign. Accompanied by dyspnea, resolves approximately 5 minutes after rest. Key examination changes: Normal CV exam except widely split S2.  Key new findings / data: ECG with NSR, old RBBB, no acute repol changes. Normal enzymes.  PLAN: Coronary CTA today. Cath if there is a high risk stenosis or multivessel disease. Will not tolerate beta blocker due to bradycardia. Amlodipine as antianginal. On ASA and statin already, LDL<70.  Sanda Klein, MD, [place] [phone] 02/18/2019, 11:48 AM

## 2019-02-18 NOTE — Care Management Obs Status (Signed)
[place] NOTIFICATION   Patient Details  Name: Jeremy Cortez MRN: [id number] Date of Birth: August 05, 1935   Medicare Observation Status Notification Given:  Yes    Midge Minium RN, BSN, NCM-BC, ACM-RN [phone] 02/18/2019, 2:53 PM

## 2019-02-18 NOTE — Consult Note (Addendum)
Cardiology Consultation:   Patient ID: Jeremy Cortez MRN: [id number]; DOB: 1935-02-21  Admit date: 02/17/2019 Date of Consult: 02/18/2019  Primary Care Provider: Prince Solian, MD Primary Cardiologist: No primary care provider on file. New Primary Electrophysiologist:  None    Patient Profile:   Jeremy Cortez is a 84 y.o. male with a hx of hypothyroidism, RBBB since 2014 otherwise no cardiac hx who is being seen today for the evaluation of chest pain at the request of Dr. Verlon Cortez.  History of Present Illness:   Jeremy Cortez with only prior cardiac hx of RBBB and hx of normal Echo.  He doses have hx elevated PSA, hypothyroidism.  Presented to ER last pm with dyspnea and chest tightness with walking up the stairs.  This was his second episode.  Both times the chest pressure resolved with rest.   Both times a little more strenuous than he is used to doing. With rest it does go away.   EKG:  The EKG was personally reviewed and demonstrates:  SR with RBBB no old to compare Telemetry:  Telemetry was personally reviewed and demonstrates:  SR RBBB  Troponin 0.00 <0.03 X 2  Na 139 k+ 4.0, BUN 17, Cr 0.92  Hgb 15.2 WBC 7.6 plts 184  TSH 3.828  CXR 2 V : IMPRESSION: No active cardiopulmonary disease.  Echo pending.  No pain currently   Past Medical History:  Diagnosis Date  . Abnormal PSA   . Bundle branch block, right   . Cataract   . Colon polyps    adenomatous  . Elevated prostate specific antigen (PSA)   . Erectile dysfunction   . Hypothyroidism   . Impaired glucose tolerance   . Migraine   . Nephrolithiasis     Past Surgical History:  Procedure Laterality Date  . cataract surgery    . EYE SURGERY  2008  . REFRACTIVE SURGERY     01/13/2013     Home Medications:  Prior to Admission medications   Medication Sig Start Date End Date Taking? Authorizing Provider  acetaminophen (TYLENOL) 500 MG tablet Take 500-1,000 mg by mouth every 8 (eight) hours as needed (FOR  BACK PAIN).   Yes [provider]  aspirin 81 MG tablet Take 81 mg by mouth at bedtime.    Yes [provider]  Lecithin 1200 MG CAPS Take 2,400 mg by mouth daily.    Yes [provider]  levothyroxine (SYNTHROID, LEVOTHROID) 75 MCG tablet Take 75 mcg by mouth daily before breakfast.   Yes [provider]  Omega-3 Fatty Acids (FISH OIL PO) Take 2 capsules by mouth daily with breakfast.    Yes [provider]  rosuvastatin (CRESTOR) 5 MG tablet Take 5 mg by mouth daily. 12/08/18  Yes [provider]    Inpatient Medications: Scheduled Meds: . aspirin  81 mg Oral QHS  . atorvastatin  40 mg Oral q1800  . enoxaparin (LOVENOX) injection  40 mg Subcutaneous Q24H  . levothyroxine  75 mcg Oral QAC breakfast   Continuous Infusions:  PRN Meds: acetaminophen, nitroGLYCERIN, ondansetron (ZOFRAN) IV  Allergies:   No Known Allergies  Social History:   Social History   Socioeconomic History  . Marital status: Married    Spouse name: Not on file  . Number of children: 3  . Years of education: Not on file  . Highest education level: Not on file  Occupational History  . Occupation: retired  Scientific laboratory technician  . Financial resource strain: Not  on file  . Food insecurity:    Worry: Not on file    Inability: Not on file  . Transportation needs:    Medical: Not on file    Non-medical: Not on file  Tobacco Use  . Smoking status: Never Smoker  . Smokeless tobacco: Never Used  Substance and Sexual Activity  . Alcohol use: Yes    Comment: wine occasionally  . Drug use: No  . Sexual activity: Not on file  Lifestyle  . Physical activity:    Days per week: Not on file    Minutes per session: Not on file  . Stress: Not on file  Relationships  . Social connections:    Talks on phone: Not on file    Gets together: Not on file    Attends religious service: Not on file    Active member of club or organization: Not on file    Attends meetings  of clubs or organizations: Not on file    Relationship status: Not on file  . Intimate partner violence:    Fear of current or ex partner: Not on file    Emotionally abused: Not on file    Physically abused: Not on file    Forced sexual activity: Not on file  Other Topics Concern  . Not on file  Social History Narrative  . Not on file    Family History:    Family History  Problem Relation Age of Onset  . Muscular dystrophy Father   . Ulcers Father   . Benign prostatic hyperplasia Brother   . Prostate cancer Brother   . CAD Brother   . Peripheral vascular disease Brother   . Colon cancer Neg Hx   . Esophageal cancer Neg Hx   . Rectal cancer Neg Hx   . Stomach cancer Neg Hx      ROS:  Please see the history of present illness.  General:no colds or fevers, no weight changes Skin:no rashes or ulcers HEENT:no blurred vision, no congestion CV:see HPI PUL:see HPI GI:no diarrhea constipation or melena, no indigestion GU:no hematuria, no dysuria MS:no joint pain, no claudication Neuro:no syncope, no lightheadedness Endo:no diabetes, no thyroid disease  All other ROS reviewed and negative.     Physical Exam/Data:   Vitals:   02/17/19 2145 02/17/19 2200 02/18/19 0352 02/18/19 0804  BP: (!) 161/74 (!) 160/77  138/74  Pulse: (!) 57 (!) 53 (!) 51 (!) 55  Resp: 16 14 15 18   Temp:      TempSrc:      SpO2: 100% 100% 99% 98%  Weight:      Height:       No intake or output data in the 24 hours ending 02/18/19 0932 Last 3 Weights 02/17/2019 07/15/2017 05/15/2017  Weight (lbs) 180 lb 184 lb 183 lb  Weight (kg) 81.647 kg 83.462 kg 83.008 kg     Body mass index is 25.1 kg/m.  General:  Well nourished, well developed, in no acute distress  HEENT: normal Lymph: no adenopathy Neck: no JVD Endocrine:  No thryomegaly Vascular: No carotid bruits; pedal pulses 2+ bilaterally without bruits  Cardiac:  normal S1, S2; RRR; no murmur gallup rub or click Lungs:  clear to auscultation  bilaterally, no wheezing, rhonchi or rales  Abd: soft, nontender, no hepatomegaly  Ext: no edema Musculoskeletal:  No deformities, BUE and BLE strength normal and equal Skin: warm and dry  Neuro: alert and oriented X 3 MAE, follows commands no focal abnormalities  noted Psych:  Normal affect     Relevant CV Studies: No old  Laboratory Data:  Chemistry Recent Labs  Lab 02/17/19 1704  NA 139  K 4.0  CL 105  CO2 26  GLUCOSE 194*  BUN 17  CREATININE 0.92  CALCIUM 9.0  GFRNONAA >60  GFRAA >60  ANIONGAP 8    No results for input(s): PROT, ALBUMIN, AST, ALT, ALKPHOS, BILITOT in the last 168 hours. Hematology Recent Labs  Lab 02/17/19 1704  WBC 7.6  RBC 4.82  HGB 15.2  HCT 46.0  MCV 95.4  MCH 31.5  MCHC 33.0  RDW 12.2  PLT 184   Cardiac Enzymes Recent Labs  Lab 02/17/19 2020 02/18/19 0353  TROPONINI <0.03 <0.03    Recent Labs  Lab 02/17/19 1716  TROPIPOC 0.00    BNPNo results for input(s): BNP, PROBNP in the last 168 hours.  DDimer No results for input(s): DDIMER in the last 168 hours.  Radiology/Studies:  Dg Chest 2 View  Result Date: 02/17/2019 CLINICAL DATA:  Short of breath EXAM: CHEST - 2 VIEW COMPARISON:  06/04/2017 FINDINGS: Normal heart size. Lungs clear. No pneumothorax. No pleural effusion. IMPRESSION: No active cardiopulmonary disease. Electronically Signed   By: Marybelle Killings M.D.   On: 02/17/2019 18:28    Assessment and Plan:   1. Exertional angina has happened twice -  Neg for MI. Dr. Sallyanne Kuster to see.  Will proceed with cardiac CTA to further eval  With FFR.   2. Chronic RBBB 3. HTN on arrival --still elevated at times but HR in the 50s so will not add BB, BP comes down at times. Will monitor.  4. HLD on statin crestor 5 mg T chol 117, HDL 39 and LDL 59       For questions or updates, please contact [facility] [place] Please consult [email or web address] for contact info under     Signed, Cecilie Kicks, NP  02/18/2019 9:32 AM   I have seen  and examined the patient along with Cecilie Kicks, NP .  I have reviewed the chart, notes and new data.  I agree with PA/NP's note.  Key new complaints: describes typical exertional angina pectoris ("unstable" due to recent onset, but clearly related to greater than usual activity), uses Levine sign. Accompanied by dyspnea, resolves approximately 5 minutes after rest. Key examination changes: Normal CV exam except widely split S2.  Key new findings / data: ECG with NSR, old RBBB, no acute repol changes. Normal enzymes.  PLAN: Coronary CTA today. Cath if there is a high risk stenosis or multivessel disease. Will not tolerate beta blocker due to bradycardia. Amlodipine as antianginal. On ASA and statin already, LDL<70.  Sanda Klein, MD, [place] [phone] 02/18/2019, 11:48 AM

## 2019-02-19 DIAGNOSIS — I208 Other forms of angina pectoris: Secondary | ICD-10-CM | POA: Diagnosis not present

## 2019-02-19 LAB — COMPREHENSIVE METABOLIC PANEL
ALT: 16 U/L (ref 0–44)
AST: 17 U/L (ref 15–41)
Albumin: 3.3 g/dL — ABNORMAL LOW (ref 3.5–5.0)
Alkaline Phosphatase: 63 U/L (ref 38–126)
Anion gap: 7 (ref 5–15)
BUN: 12 mg/dL (ref 8–23)
CO2: 23 mmol/L (ref 22–32)
Calcium: 8.4 mg/dL — ABNORMAL LOW (ref 8.9–10.3)
Chloride: 107 mmol/L (ref 98–111)
Creatinine, Ser: 0.89 mg/dL (ref 0.61–1.24)
GFR calc non Af Amer: >60 mL/min (ref >60)
Glucose, Bld: 139 mg/dL — ABNORMAL HIGH (ref 70–99)
Potassium: 4.1 mmol/L (ref 3.5–5.1)
SODIUM: 137 mmol/L (ref 135–145)
Total Bilirubin: 0.9 mg/dL (ref 0.3–1.2)
Total Protein: 6 g/dL — ABNORMAL LOW (ref 6.5–8.1)

## 2019-02-19 LAB — CBC
HCT: 43.5 % (ref 39.0–52.0)
Hemoglobin: 14.6 g/dL (ref 13.0–17.0)
MCH: 31.3 pg (ref 26.0–34.0)
MCHC: 33.6 g/dL (ref 30.0–36.0)
MCV: 93.3 fL (ref 80.0–100.0)
PLATELETS: 167 10*3/uL (ref 150–400)
RBC: 4.66 MIL/uL (ref 4.22–5.81)
RDW: 12 % (ref 11.5–15.5)
WBC: 7.5 10*3/uL (ref 4.0–10.5)
nRBC: 0 % (ref 0.0–0.2)

## 2019-02-19 LAB — HEMOGLOBIN A1C
Hgb A1c MFr Bld: 7.7 % — ABNORMAL HIGH (ref 4.8–5.6)
Mean Plasma Glucose: 174.29 mg/dL

## 2019-02-19 MED ORDER — METOPROLOL SUCCINATE ER 25 MG PO TB24: 12.5000 mg | ORAL_TABLET | Freq: Every day | ORAL | 1 refills | Status: AC

## 2019-02-19 MED ORDER — METFORMIN HCL 500 MG PO TABS: 500.0000 mg | ORAL_TABLET | Freq: Two times a day (BID) | ORAL | 1 refills | Status: DC

## 2019-02-19 MED ORDER — AMLODIPINE BESYLATE 5 MG PO TABS: 5.0000 mg | ORAL_TABLET | Freq: Every day | ORAL | 1 refills | Status: DC

## 2019-02-19 MED ORDER — NITROGLYCERIN 0.4 MG SL SUBL: 0.4000 mg | SUBLINGUAL_TABLET | SUBLINGUAL | 1 refills | Status: DC | PRN

## 2019-02-19 MED ORDER — ATORVASTATIN CALCIUM 80 MG PO TABS: 80.0000 mg | ORAL_TABLET | Freq: Every day | ORAL | 3 refills | Status: AC

## 2019-02-19 MED ORDER — METOPROLOL SUCCINATE ER 25 MG PO TB24
12.5000 mg | ORAL_TABLET | Freq: Every day | ORAL | Status: DC
  Administered 2019-02-19: 12.5 mg via ORAL
  Filled 2019-02-19: qty 1

## 2019-02-19 NOTE — Discharge Summary (Signed)
Physician Discharge Summary  Jeremy Cortez [name]:[id number] DOB: 09-05-1935 DOA: 02/17/2019  PCP: Prince Solian, MD  Admit date: 02/17/2019 Discharge date: 02/19/2019  Time spent: 25 minutes  Recommendations for Outpatient Follow-up:  1. Recommend outpatient titration amlodipine/metoprolol as needed 2. Needs TFTs in 4 to 6 weeks 3. Needs be met in 1 week 4. Would recommend initiation of metformin and have started the same 500 twice daily and titration of this as an outpatient  Discharge Diagnoses:  Principal Problem:   Stable angina ([place]) Active Problems:   Sinus bradycardia   HLD (hyperlipidemia)   Hypothyroidism   Abnormal computed tomography angiography (CTA)   Discharge Condition: Improved  Diet recommendation: Heart healthy  Filed Weights   02/18/19 1011 02/18/19 1600 02/19/19 0440  Weight: 81.6 kg 81.4 kg 81.3 kg    History of present illness:  84 year old male Non-smoker-probable allergies status post multiple rounds of antibiotics in 2018 referred to pulmonary for may be abnormal CT Hypothyroidism BPH Impaired glucose tolerance Nephrolithiasis Internal hemorrhoids Chronic right bundle branch block  Admitted from home with 3-week history of shortness of breath chest pressure after climbing stadium stairs-describes as substernal, nonradiating pressure  Heart score of 4 ascribed to patient  Hospital Course:  Chest pain, heart score 4             Suspicious for Stable angina--DDX for PE less likely            --Not tachy,  no hypoxia and occur at rest             Troponins negative  CTA performed and then followed by cath as below medical management  added on this admission amlodipine metoprolol aspirin nitroglycerin will  need outpatient follow-up Hyperlipidemia             Increased to 80 mg atorvastatin             LAbs look great -followup as an outpatient  BPH             No meds-some report of + PSA in past--OP screeening Hypothyroidism              TSH wnl-cont synthroid 75  Get TSH in about 1 month New onset diabetes mellitus A1c 7.7             Sugar ranging between 139 and 194 so will need outpatient meter as well as initiation of metformin and follow-up with primary care physician Sinus bradycardia Chronic right bundle branch block             Could be consitutional-Well conditioned for age--plays golf x 2       per week--keep on tele to r/o other issues Prior nephrolithiasis  Procedures: Cardiac catheterization Cardiac catheterization:  Dist RCA lesion is 20% stenosed.  Ost 1st Mrg to 1st Mrg lesion is 40% stenosed.  2nd Mrg lesion is 30% stenosed.  Dist LM to Ost LAD lesion is 40% stenosed.  Ost 1st Diag to 1st Diag lesion is 60% stenosed.  1st Diag lesion is 90% stenosed.  Prox LAD lesion is 40% stenosed.  The left ventricular systolic function is normal.  LV end diastolic pressure is normal.   Consultations:  Cardiology  Discharge Exam: Vitals:   02/19/19 1148 02/19/19 1218  BP: (!) 146/64 137/60  Pulse: 62 (!) 58  Resp:  18  Temp:  98.1 F (36.7 C)  SpO2:  97%    General: Awake pleasant alert no distress Cardiovascular: S1-S2 no  murmur Respiratory: Clinically clear no added sound Abdomen soft no rebound no guarding  Discharge Instructions   Discharge Instructions    Diet - low sodium heart healthy   Complete by:  As directed    Discharge instructions   Complete by:  As directed    You will be on a bunch of new medications that will help with control of chest pain and their blood pressure medications and help with increasing the oxygen and blood flow through smaller arteries I would advise you to take these medications as has been prescribed-please report any bleeding any shortness of breath or any crushing chest pain You can take nitroglycerin which is a medication that opens up the arteries when you are having pain up to 3 times-do not leave this in the sun or do not leave this unused for  over 2 or 3 months as it loses its effectiveness-please report problems to the cardiologist who should be calling to follow-up with you Please remember our discussion about diet and regular exercise and continue to keep up the good habits It has been a pleasure God Bless and good luck to you   Increase activity slowly   Complete by:  As directed      Allergies as of 02/19/2019   No Known Allergies     Medication List    STOP taking these medications   rosuvastatin 5 MG tablet Commonly known as:  CRESTOR     TAKE these medications   acetaminophen 500 MG tablet Commonly known as:  TYLENOL Take 500-1,000 mg by mouth every 8 (eight) hours as needed (FOR BACK PAIN).   amLODipine 5 MG tablet Commonly known as:  NORVASC Take 1 tablet (5 mg total) by mouth daily. Start taking on:  February 20, 2019   aspirin 81 MG tablet Take 81 mg by mouth at bedtime.   atorvastatin 80 MG tablet Commonly known as:  LIPITOR Take 1 tablet (80 mg total) by mouth daily at 6 PM.   FISH OIL PO Take 2 capsules by mouth daily with breakfast.   Lecithin 1200 MG Caps Take 2,400 mg by mouth daily.   levothyroxine 75 MCG tablet Commonly known as:  SYNTHROID, LEVOTHROID Take 75 mcg by mouth daily before breakfast.   metoprolol succinate 25 MG 24 hr tablet Commonly known as:  TOPROL-XL Take 0.5 tablets (12.5 mg total) by mouth daily. Start taking on:  February 20, 2019   nitroGLYCERIN 0.4 MG SL tablet Commonly known as:  NITROSTAT Place 1 tablet (0.4 mg total) under the tongue every 5 (five) minutes as needed for chest pain.      No Known Allergies Follow-up Information    Croitoru, Mihai, MD Follow up.   Specialty:  Cardiology Why:   the ofice should call Monday or Tuesday with follow up with PA or NP in next 1-2 weeks and Dr. Sallyanne Kuster in 6 weeks.  if you have not heard by tuesday afternoon call the office Contact information: [address] [place] [address] [phone]             The results of significant diagnostics from this hospitalization (including imaging, microbiology, ancillary and laboratory) are listed below for reference.    Significant Diagnostic Studies: Dg Chest 2 View  Result Date: 02/17/2019 CLINICAL DATA:  Short of breath EXAM: CHEST - 2 VIEW COMPARISON:  06/04/2017 FINDINGS: Normal heart size. Lungs clear. No pneumothorax. No pleural effusion. IMPRESSION: No active cardiopulmonary disease. Electronically Signed   By:  Marybelle Killings M.D.   On: 02/17/2019 18:28   Ct Coronary Morph W/cta Cor W/score W/ca W/cm &/or Wo/cm  Addendum Date: 02/18/2019   ADDENDUM REPORT: 02/18/2019 17:08 CLINICAL DATA:  81M with hypothyroidism, RBBB and chest pain. EXAM: Cardiac/Coronary  CT TECHNIQUE: The patient was scanned on a [employer]. FINDINGS: A 120 kV prospective scan was triggered in the descending thoracic aorta at 111 HU's. Axial non-contrast 3 mm slices were carried out through the heart. The data set was analyzed on a dedicated work station and scored using the [place]. Gantry rotation speed was 250 msecs and collimation was .6 mm. No beta blockade and 0.8 mg of sl NTG was given. The 3D data set was reconstructed in 5% intervals of the 67-82 % of the R-R cycle. Diastolic phases were analyzed on a dedicated work station using MPR, MIP and VRT modes. The patient received 80 cc of contrast. Aorta: Normal size. Ascending aorta 3.6 cm. Calcification of the aortic root and descending aorta. No dissection. Aortic Valve:  Trileaflet.  No calcifications. Coronary Arteries:  Normal coronary origin.  Right dominance. RCA is a large dominant artery that gives rise to PDA and 2 PLV branches. There is 30-40% soft plaque at the ostium of the RCA, 20-30% mixed plaque in the proximal to mid RCA, and 40-50% soft attenuation plaque in the distal RCA just prior to the PDA. Left main is a short artery that gives rise to LAD and LCX arteries. Heavily calcified.  Stenosis severity difficult to assess due to blooming artifact. Likely 20-30%. LAD is a large vessel that 50-60% soft attenuation plaque in the ostium followed by a heavily calcified proximal LAD concerning for severe obstruction (>70%). There is 20-30% soft plaque in the mid LAD. There is a branching D1 with 60-70% calcified plaque in the ostial and proximal vessel. LCX is a non-dominant artery that gives rise to 2 large OM branches. There is 20-30% soft plaque in LCX near OM2. OM1 has 30-40% calcified plaque proximally. OM2 has 20-30% calcified plaque. Other findings: Normal pulmonary vein drainage into the left atrium. Normal let atrial appendage without a thrombus. Normal size of the pulmonary artery. IMPRESSION: 1. Coronary calcium score of 743. This was 62nd percentile for age and sex matched control. 2. Normal coronary origin with right dominance. 3. The left main and proximal LAD are heavily calcified and assessment of severity is limited by blooming artifact. There is concern for obstructive disease in the proximal LAD. 4.  Non-obstructive CAD in the RCA and LCX territories. 5.  Recommend cardiac catheterization. Skeet Latch, MD Electronically Signed   By: Skeet Latch   On: 02/18/2019 17:08   Result Date: 02/18/2019 EXAM: OVER-READ INTERPRETATION  CT CHEST The following report is an over-read performed by radiologist Dr. Suzy Bouchard of [facility], [place] on 02/18/2019. This over-read does not include interpretation of cardiac or coronary anatomy or pathology. The coronary calcium score interpretation by the cardiologist is attached. COMPARISON:  Chest CT 06/04/2017 FINDINGS: Limited view of the lung parenchyma demonstrates no suspicious nodularity. Airways are normal. Limited view of the mediastinum demonstrates no adenopathy. Esophagus normal. Limited view of the upper abdomen unremarkable. Limited view of the skeleton and chest wall is unremarkable. IMPRESSION: No significant  extracardiac findings. Electronically Signed: By: Suzy Bouchard M.D. On: 02/18/2019 14:33    Microbiology: No results found for this or any previous visit (from the past 240 hour(s)).   Labs: Basic Metabolic Panel: Recent Labs  Lab  02/17/19 1704 02/19/19 0643  NA 139 137  K 4.0 4.1  CL 105 107  CO2 26 23  GLUCOSE 194* 139*  BUN 17 12  CREATININE 0.92 0.89  CALCIUM 9.0 8.4*   Liver Function Tests: Recent Labs  Lab 02/19/19 0643  AST 17  ALT 16  ALKPHOS 63  BILITOT 0.9  PROT 6.0*  ALBUMIN 3.3*   No results for input(s): LIPASE, AMYLASE in the last 168 hours. No results for input(s): AMMONIA in the last 168 hours. CBC: Recent Labs  Lab 02/17/19 1704 02/19/19 0643  WBC 7.6 7.5  HGB 15.2 14.6  HCT 46.0 43.5  MCV 95.4 93.3  PLT 184 167   Cardiac Enzymes: Recent Labs  Lab 02/17/19 2020 02/18/19 0353 02/18/19 1022  TROPONINI <0.03 <0.03 <0.03   BNP: BNP (last 3 results) No results for input(s): BNP in the last 8760 hours.  ProBNP (last 3 results) No results for input(s): PROBNP in the last 8760 hours.  CBG: No results for input(s): GLUCAP in the last 168 hours.     Signed:  Nita Sells MD   [facility] 02/19/2019, 2:02 PM

## 2019-02-19 NOTE — Progress Notes (Signed)
Progress Note  Patient Name: Jeremy Cortez Date of Encounter: 02/19/2019  Primary Cardiologist: Dr Sallyanne Kuster  Subjective   No CP or dyspnea  Inpatient Medications    Scheduled Meds: . amLODipine  5 mg Oral Daily  . aspirin  81 mg Oral Daily  . atorvastatin  80 mg Oral q1800  . enoxaparin (LOVENOX) injection  40 mg Subcutaneous Q24H  . levothyroxine  75 mcg Oral QAC breakfast  . sodium chloride flush  3 mL Intravenous Q12H   Continuous Infusions: . sodium chloride 150 mL/hr at 02/18/19 1951  . sodium chloride     PRN Meds: sodium chloride, acetaminophen, nitroGLYCERIN, ondansetron (ZOFRAN) IV, sodium chloride flush   Vital Signs    Vitals:   02/18/19 1950 02/19/19 0011 02/19/19 0440 02/19/19 0815  BP: 123/70 126/83 118/62 (!) 147/69  Pulse: (!) 55 (!) 56 (!) 54   Resp:  18 18   Temp: 98.1 F (36.7 C) 97.8 F (36.6 C) 98.7 F (37.1 C)   TempSrc: Oral Oral Oral   SpO2: 96% 94% 96%   Weight:   81.3 kg   Height:        Intake/Output Summary (Last 24 hours) at 02/19/2019 1009 Last data filed at 02/19/2019 0951 Gross per 24 hour  Intake 861.3 ml  Output 1675 ml  Net -813.7 ml   Last 3 Weights 02/19/2019 02/18/2019 02/18/2019  Weight (lbs) 179 lb 4.8 oz 179 lb 7.3 oz 180 lb  Weight (kg) 81.33 kg 81.4 kg 81.647 kg      Telemetry    Sinus - Personally Reviewed  Physical Exam   GEN: No acute distress.   Neck: No JVD Cardiac: RRR, no murmurs, rubs, or gallops.  Respiratory: Clear to auscultation bilaterally. GI: Soft, nontender, non-distended  MS: No edema; radial cath site with no hematoma Neuro:  Nonfocal  Psych: Normal affect   Labs    Chemistry Recent Labs  Lab 02/17/19 1704 02/19/19 0643  NA 139 137  K 4.0 4.1  CL 105 107  CO2 26 23  GLUCOSE 194* 139*  BUN 17 12  CREATININE 0.92 0.89  CALCIUM 9.0 8.4*  PROT  --  6.0*  ALBUMIN  --  3.3*  AST  --  17  ALT  --  16  ALKPHOS  --  63  BILITOT  --  0.9  GFRNONAA >60 >60  GFRAA >60 >60    ANIONGAP 8 7     Hematology Recent Labs  Lab 02/17/19 1704 02/19/19 0643  WBC 7.6 7.5  RBC 4.82 4.66  HGB 15.2 14.6  HCT 46.0 43.5  MCV 95.4 93.3  MCH 31.5 31.3  MCHC 33.0 33.6  RDW 12.2 12.0  PLT 184 167    Cardiac Enzymes Recent Labs  Lab 02/17/19 2020 02/18/19 0353 02/18/19 1022  TROPONINI <0.03 <0.03 <0.03    Recent Labs  Lab 02/17/19 1716  TROPIPOC 0.00     Radiology    Dg Chest 2 View  Result Date: 02/17/2019 CLINICAL DATA:  Short of breath EXAM: CHEST - 2 VIEW COMPARISON:  06/04/2017 FINDINGS: Normal heart size. Lungs clear. No pneumothorax. No pleural effusion. IMPRESSION: No active cardiopulmonary disease. Electronically Signed   By: Marybelle Killings M.D.   On: 02/17/2019 18:28   Ct Coronary Morph W/cta Cor W/score W/ca W/cm &/or Wo/cm  Addendum Date: 02/18/2019   ADDENDUM REPORT: 02/18/2019 17:08 CLINICAL DATA:  29M with hypothyroidism, RBBB and chest pain. EXAM: Cardiac/Coronary  CT TECHNIQUE: The patient was  scanned on a [employer]. FINDINGS: A 120 kV prospective scan was triggered in the descending thoracic aorta at 111 HU's. Axial non-contrast 3 mm slices were carried out through the heart. The data set was analyzed on a dedicated work station and scored using the [place]. Gantry rotation speed was 250 msecs and collimation was .6 mm. No beta blockade and 0.8 mg of sl NTG was given. The 3D data set was reconstructed in 5% intervals of the 67-82 % of the R-R cycle. Diastolic phases were analyzed on a dedicated work station using MPR, MIP and VRT modes. The patient received 80 cc of contrast. Aorta: Normal size. Ascending aorta 3.6 cm. Calcification of the aortic root and descending aorta. No dissection. Aortic Valve:  Trileaflet.  No calcifications. Coronary Arteries:  Normal coronary origin.  Right dominance. RCA is a large dominant artery that gives rise to PDA and 2 PLV branches. There is 30-40% soft plaque at the ostium of the RCA, 20-30%  mixed plaque in the proximal to mid RCA, and 40-50% soft attenuation plaque in the distal RCA just prior to the PDA. Left main is a short artery that gives rise to LAD and LCX arteries. Heavily calcified. Stenosis severity difficult to assess due to blooming artifact. Likely 20-30%. LAD is a large vessel that 50-60% soft attenuation plaque in the ostium followed by a heavily calcified proximal LAD concerning for severe obstruction (>70%). There is 20-30% soft plaque in the mid LAD. There is a branching D1 with 60-70% calcified plaque in the ostial and proximal vessel. LCX is a non-dominant artery that gives rise to 2 large OM branches. There is 20-30% soft plaque in LCX near OM2. OM1 has 30-40% calcified plaque proximally. OM2 has 20-30% calcified plaque. Other findings: Normal pulmonary vein drainage into the left atrium. Normal let atrial appendage without a thrombus. Normal size of the pulmonary artery. IMPRESSION: 1. Coronary calcium score of 743. This was 62nd percentile for age and sex matched control. 2. Normal coronary origin with right dominance. 3. The left main and proximal LAD are heavily calcified and assessment of severity is limited by blooming artifact. There is concern for obstructive disease in the proximal LAD. 4.  Non-obstructive CAD in the RCA and LCX territories. 5.  Recommend cardiac catheterization. Skeet Latch, MD Electronically Signed   By: Skeet Latch   On: 02/18/2019 17:08   Result Date: 02/18/2019 EXAM: OVER-READ INTERPRETATION  CT CHEST The following report is an over-read performed by radiologist Dr. Suzy Bouchard of [facility], [place] on 02/18/2019. This over-read does not include interpretation of cardiac or coronary anatomy or pathology. The coronary calcium score interpretation by the cardiologist is attached. COMPARISON:  Chest CT 06/04/2017 FINDINGS: Limited view of the lung parenchyma demonstrates no suspicious nodularity. Airways are normal. Limited view of  the mediastinum demonstrates no adenopathy. Esophagus normal. Limited view of the upper abdomen unremarkable. Limited view of the skeleton and chest wall is unremarkable. IMPRESSION: No significant extracardiac findings. Electronically Signed: By: Suzy Bouchard M.D. On: 02/18/2019 14:33     Patient Profile     84 y.o. male admitted with exertional angina.  Echocardiogram shows normal LV function.  Cardiac catheterization:  Dist RCA lesion is 20% stenosed.  Ost 1st Mrg to 1st Mrg lesion is 40% stenosed.  2nd Mrg lesion is 30% stenosed.  Dist LM to Ost LAD lesion is 40% stenosed.  Ost 1st Diag to 1st Diag lesion is 60% stenosed.  1st Diag lesion is  90% stenosed.  Prox LAD lesion is 40% stenosed.  The left ventricular systolic function is normal.  LV end diastolic pressure is normal.  Initial trial of medical therapy felt warranted.  Assessment & Plan    1 coronary artery disease-catheterization as outlined above.  Initial plan is medical therapy.  Continue aspirin and statin.  Amlodipine was added as an antianginal.  I will add Toprol 12.5 mg daily.  Medications can be advanced as an outpatient as tolerated.  2 hypertension-continue present medications as outlined above.  Follow blood pressure and increase medications as needed.  3 hyperlipidemia-continue statin.  Check lipids and liver in 6 weeks.  [facility] will sign off.   Medication Recommendations: Continue medications as listed in MAR. Other recommendations (labs, testing, etc): No further cardiology testing planned. Follow up as an outpatient: APP 1 week. FU Dr Sallyanne Kuster 6 weeks  For questions or updates, please contact [place] Please consult [email or web address] for contact info under   Signed, Kirk Ruths, MD  02/19/2019, 10:09 AM

## 2019-02-19 NOTE — Discharge Instructions (Signed)
Call [place] at [phone] if any bleeding, swelling or drainage at cath site.  May shower, no tub baths for 48 hours for groin sticks. No lifting over 5 pounds for 3 days.  No Driving for 3 days.    Heart healthy diet

## 2019-02-19 NOTE — Progress Notes (Signed)
Discharge instructions given to patient and his wife. All questions answered and concerns addressed.

## 2019-02-21 ENCOUNTER — Encounter (HOSPITAL_COMMUNITY): Payer: Self-pay | Admitting: Cardiovascular Disease

## 2019-03-17 ENCOUNTER — Telehealth: Payer: Self-pay

## 2019-03-17 NOTE — Telephone Encounter (Signed)
   Primary Cardiologist:  Sanda Klein, MD   Patient contacted.  History reviewed.  No symptoms to suggest any unstable cardiac conditions.  Based on discussion, with current pandemic situation, we will be postponing this appointment for Jeremy Cortez with a plan for f/u in 4-6 wks or sooner if feasible/necessary.  If symptoms change, he has been instructed to contact our office.    Harold Hedge, CMA  03/17/2019 4:25 PM         .

## 2019-03-21 ENCOUNTER — Ambulatory Visit: Payer: Medicare Other | Admitting: Cardiology

## 2019-04-19 ENCOUNTER — Other Ambulatory Visit: Payer: Self-pay | Admitting: Cardiovascular Disease

## 2019-04-19 MED ORDER — AMLODIPINE BESYLATE 5 MG PO TABS: 5.0000 mg | ORAL_TABLET | Freq: Every day | ORAL | 1 refills | Status: AC

## 2019-04-19 MED ORDER — METFORMIN HCL 500 MG PO TABS: 500.0000 mg | ORAL_TABLET | Freq: Two times a day (BID) | ORAL | 1 refills | Status: DC

## 2019-04-19 NOTE — Telephone Encounter (Signed)
Amlodipine 5 mg and Glucophage 500 mg refilled.

## 2019-04-19 NOTE — Telephone Encounter (Signed)
°*  STAT* If patient is at the pharmacy, call can be transferred to refill team.   1. Which medications need to be refilled? (please list name of each medication and dose if known) Dr C wrote these prescriptions when he was in the hospital- Amlodipine and Metformin  2. Which pharmacy/location (including street and city if local pharmacy) is medication to be sent to? [facility] [phone]  3. Do they need a 30 day or 90 day supply? 90 days and refills

## 2019-04-25 ENCOUNTER — Telehealth: Payer: Self-pay | Admitting: Cardiovascular Disease

## 2019-04-25 NOTE — Telephone Encounter (Signed)
Smartphone/ my chart via email/ consent/ pre reg completed °

## 2019-04-26 ENCOUNTER — Telehealth (INDEPENDENT_AMBULATORY_CARE_PROVIDER_SITE_OTHER): Payer: Medicare Other | Admitting: Cardiovascular Disease

## 2019-04-26 ENCOUNTER — Encounter: Payer: Self-pay | Admitting: Cardiovascular Disease

## 2019-04-26 VITALS — BP 130/77 | HR 67 | Ht 72.0 in | Wt 175.0 lb

## 2019-04-26 DIAGNOSIS — E7800 Pure hypercholesterolemia, unspecified: Secondary | ICD-10-CM

## 2019-04-26 DIAGNOSIS — I25118 Atherosclerotic heart disease of native coronary artery with other forms of angina pectoris: Secondary | ICD-10-CM

## 2019-04-26 DIAGNOSIS — R001 Bradycardia, unspecified: Secondary | ICD-10-CM

## 2019-04-26 DIAGNOSIS — I208 Other forms of angina pectoris: Secondary | ICD-10-CM

## 2019-04-26 NOTE — Patient Instructions (Signed)
Medication Instructions:  Continue same medications  If you need a refill on your cardiac medications before your next appointment, please call your pharmacy.   Lab work: None ordered  Testing/Procedures: None ordered  Follow-Up: At [facility], you and your health needs are our priority.  As part of our continuing mission to provide you with exceptional heart care, we have created designated Provider Care Teams.  These Care Teams include your primary Cardiologist (physician) and Advanced Practice Providers (APPs -  Physician Assistants and Nurse Practitioners) who all work together to provide you with the care you need, when you need it. . Schedule follow up appointment with Dr.Croitoru in 12 months   Call 3 months before to schedule

## 2019-04-26 NOTE — Progress Notes (Signed)
Virtual Visit via Telephone Note   This visit type was conducted due to national recommendations for restrictions regarding the COVID-19 Pandemic (e.g. social distancing) in an effort to limit this patient's exposure and mitigate transmission in our community.  Due to his co-morbid illnesses, this patient is at least at moderate risk for complications without adequate follow up.  This format is felt to be most appropriate for this patient at this time.  The patient did not have access to video technology/had technical difficulties with video requiring transitioning to audio format only (telephone).  All issues noted in this document were discussed and addressed.  No physical exam could be performed with this format.  Please refer to the patient's chart for his  consent to telehealth for [facility].   Date:  04/26/2019   ID:  Jeremy Cortez, DOB 09-29-1935, MRN [id number]  Patient Location: Home Provider Location: Home  PCP:  Prince Solian, MD  Cardiologist:  Sanda Klein, MD  Electrophysiologist:  None   Evaluation Performed:  Follow-Up Visit  Chief Complaint:  CAD f/u  History of Present Illness:    Jeremy Cortez is a 84 y.o. male with recently diagnosed coronary artery disease.  He underwent coronary angiography on February 28 which showed the presence of high-grade stenoses and relatively small branches of a large first diagonal artery.  He had 80 and 90% stenoses respectively in these branches, neither 1 of which was large enough for percutaneous revascularization.  He had scattered 30-50% stenoses in other major coronary arteries.  He has treated hyperlipidemia.  His most recent LDL cholesterol was 59, on statin therapy.  He has normal left ventricular systolic function and no symptoms of congestive heart failure.  He has been playing golf at least once a week, sometimes as many as 3 times a week.  He can easily walk 100 yards at a brisk pace without angina on his current  medications (amlodipine and metoprolol).  The dose of beta-blocker is limited by bradycardia.  He denies syncope/near syncope, dizziness, palpitations, exertional dyspnea, orthopnea, PND, leg edema, claudication or focal neurological events.  The patient does not have symptoms concerning for COVID-19 infection (fever, chills, cough, or new shortness of breath).    Past Medical History:  Diagnosis Date  . Abnormal PSA   . Bundle branch block, right   . Cataract   . Colon polyps    adenomatous  . Elevated prostate specific antigen (PSA)   . Erectile dysfunction   . Hypothyroidism   . Impaired glucose tolerance   . Migraine   . Nephrolithiasis    Past Surgical History:  Procedure Laterality Date  . cataract surgery    . EYE SURGERY  2008  . LEFT HEART CATH AND CORONARY ANGIOGRAPHY N/A 02/18/2019   Procedure: LEFT HEART CATH AND CORONARY ANGIOGRAPHY;  Surgeon: Troy Sine, MD;  Location: [facility];  Service: Cardiovascular;  Laterality: N/A;  . REFRACTIVE SURGERY     01/13/2013     Current Meds  Medication Sig  . acetaminophen (TYLENOL) 500 MG tablet Take 500-1,000 mg by mouth every 8 (eight) hours as needed (FOR BACK PAIN).  Marland Kitchen amLODipine (NORVASC) 5 MG tablet Take 1 tablet (5 mg total) by mouth daily.  Marland Kitchen aspirin 81 MG tablet Take 81 mg by mouth at bedtime.   Marland Kitchen atorvastatin (LIPITOR) 80 MG tablet Take 1 tablet (80 mg total) by mouth daily at 6 PM.  . Lecithin 1200 MG CAPS Take 2,400 mg  by mouth daily.   Marland Kitchen levothyroxine (SYNTHROID, LEVOTHROID) 75 MCG tablet Take 75 mcg by mouth daily before breakfast.  . metFORMIN (GLUCOPHAGE) 500 MG tablet Take 1 tablet (500 mg total) by mouth 2 (two) times daily with a meal.  . metoprolol succinate (TOPROL-XL) 25 MG 24 hr tablet Take 0.5 tablets (12.5 mg total) by mouth daily.  . nitroGLYCERIN (NITROSTAT) 0.4 MG SL tablet Place 1 tablet (0.4 mg total) under the tongue every 5 (five) minutes as needed for chest pain.  . Omega-3 Fatty  Acids (FISH OIL PO) Take 2 capsules by mouth daily with breakfast.      Allergies:   Patient has no known allergies.   Social History   Tobacco Use  . Smoking status: Never Smoker  . Smokeless tobacco: Never Used  Substance Use Topics  . Alcohol use: Yes    Comment: wine occasionally  . Drug use: No     Family Hx: The patient's family history includes Benign prostatic hyperplasia in his brother; CAD in his brother; Muscular dystrophy in his father; Peripheral vascular disease in his brother; Prostate cancer in his brother; Ulcers in his father. There is no history of Colon cancer, Esophageal cancer, Rectal cancer, or Stomach cancer.  ROS:   Please see the history of present illness.     All other systems reviewed and are negative.   Prior CV studies:   The following studies were reviewed today:  Coronary angiography February 18, 2019  Labs/Other Tests and Data Reviewed:    EKG:  An ECG dated 02/18/2019 was personally reviewed today and demonstrated:  Sinus bradycardia, right bundle branch block  Recent Labs: 02/18/2019: TSH 3.828 02/19/2019: ALT 16; BUN 12; Creatinine, Ser 0.89; Hemoglobin 14.6; Platelets 167; Potassium 4.1; Sodium 137   Recent Lipid Panel Lab Results  Component Value Date/Time   CHOL 117 02/18/2019 03:53 AM   TRIG 95 02/18/2019 03:53 AM   HDL 39 (L) 02/18/2019 03:53 AM   CHOLHDL 3.0 02/18/2019 03:53 AM   LDLCALC 59 02/18/2019 03:53 AM    Wt Readings from Last 3 Encounters:  04/26/19 175 lb (79.4 kg)  02/19/19 179 lb 4.8 oz (81.3 kg)  07/15/17 184 lb (83.5 kg)     Objective:    Vital Signs:  BP 130/77   Pulse 67   Ht 6' (1.829 m)   Wt 175 lb (79.4 kg)   BMI 23.73 kg/m    VITAL SIGNS:  reviewed Unable to review  ASSESSMENT & PLAN:    1. CAD: CCS class I on 2 antianginal medications.  No change in medicines appears necessary.  He is taking aspirin and a statin. 2. HLP: Lipid parameters at target. 3. HTN: Acceptable blood pressure  control  COVID-19 Education: The signs and symptoms of COVID-19 were discussed with the patient and how to seek care for testing (follow up with PCP or arrange E-visit).  The importance of social distancing was discussed today.  Time:   Today, I have spent 14 minutes with the patient with telehealth technology discussing the above problems.     Medication Adjustments/Labs and Tests Ordered: Current medicines are reviewed at length with the patient today.  Concerns regarding medicines are outlined above.   Tests Ordered: No orders of the defined types were placed in this encounter.   Medication Changes: No orders of the defined types were placed in this encounter.   Disposition:  Follow up 12 months  Signed, Sanda Klein, MD  04/26/2019 4:56 PM  [facility]

## 2020-01-18 ENCOUNTER — Ambulatory Visit: Payer: Medicare Other

## 2020-01-27 ENCOUNTER — Ambulatory Visit: Payer: Medicare Other | Attending: Internal Medicine

## 2020-01-27 DIAGNOSIS — Z23 Encounter for immunization: Secondary | ICD-10-CM

## 2020-01-27 NOTE — Progress Notes (Signed)
   Covid-19 Vaccination Clinic  Name:  NICKIE LOLLEY    MRN: [id number] DOB: 01/16/1935  01/27/2020  Mr. Sollman was observed post Covid-19 immunization for 15 minutes without incidence. He was provided with Vaccine Information Sheet and instruction to access the V-Safe system.   Mr. Blythe was instructed to call 911 with any severe reactions post vaccine: Marland Kitchen Difficulty breathing  . Swelling of your face and throat  . A fast heartbeat  . A bad rash all over your body  . Dizziness and weakness    Immunizations Administered    Name Date Dose VIS Date Route   Pfizer COVID-19 Vaccine 01/27/2020  1:30 PM 0.3 mL 12/02/2019 Intramuscular   Manufacturer: [place]   Lot: [id number]   [place]: [id number]

## 2020-02-22 ENCOUNTER — Ambulatory Visit: Payer: Medicare Other | Attending: Internal Medicine

## 2020-02-22 DIAGNOSIS — Z23 Encounter for immunization: Secondary | ICD-10-CM | POA: Insufficient documentation

## 2020-02-22 NOTE — Progress Notes (Signed)
   Covid-19 Vaccination Clinic  Name:  Jeremy Cortez    MRN: [id number] DOB: Oct 15, 1935  02/22/2020  [name] was observed post Covid-19 immunization for 15 minutes without incident. He was provided with Vaccine Information Sheet and instruction to access the V-Safe system.   [name] was instructed to call 911 with any severe reactions post vaccine: Marland Kitchen Difficulty breathing  . Swelling of face and throat  . A fast heartbeat  . A bad rash all over body  . Dizziness and weakness   Immunizations Administered    Name Date Dose VIS Date Route   Pfizer COVID-19 Vaccine 02/22/2020  8:20 AM 0.3 mL 12/02/2019 Intramuscular   Manufacturer: [place]   Lot: [id number]   [place]: [id number]

## 2020-04-04 ENCOUNTER — Encounter: Payer: Self-pay | Admitting: Cardiovascular Disease

## 2020-06-17 IMAGING — CT CT HEART MORP W/ CTA COR W/ SCORE W/ CA W/CM &/OR W/O CM
4 of 7 series · 8 of 20 positions shown, 9 images · IV contrast (APPLIED)
Comparison: Chest CT 06/04/2017
COMPARISON: Chest CT 06/04/2017
COMPARISON: Chest CT 06/04/2017

Addendum:
EXAM:
OVER-READ INTERPRETATION  CT CHEST

The following report is an over-read performed by radiologist Dr.
Scheibelhoffer Vanda Jedlinszki [REDACTED] on 02/18/2019. This
over-read does not include interpretation of cardiac or coronary
anatomy or pathology. The coronary calcium score interpretation by
the cardiologist is attached.
CLINICAL DATA: 83M with hypothyroidism, RBBB and chest pain.
Cardiac/Coronary  CT
TECHNIQUE: The patient was scanned on a Phillips Force scanner.

[Series 6: best diast 75 % · axial · 0.39mm/px · z∈[+1243,+1282]mm · 2 of 296 slices shown, 3 images]
[im 99/296  vessel]
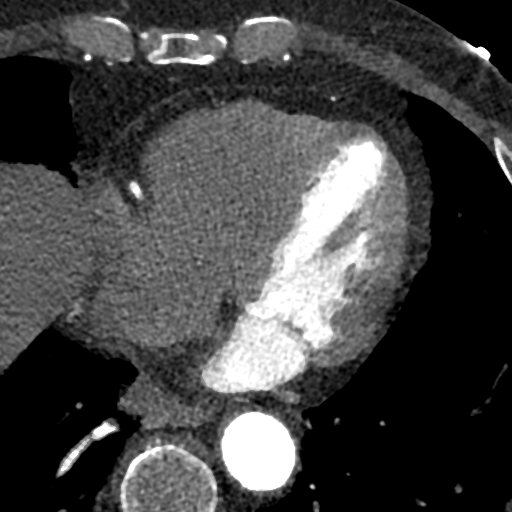
[im 99/296  lung]
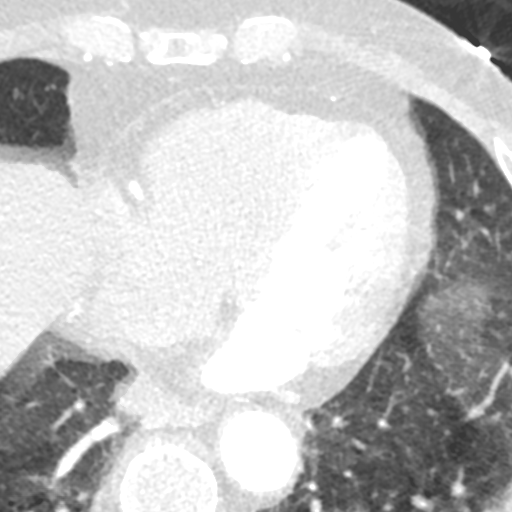
[im 197/296  vessel]
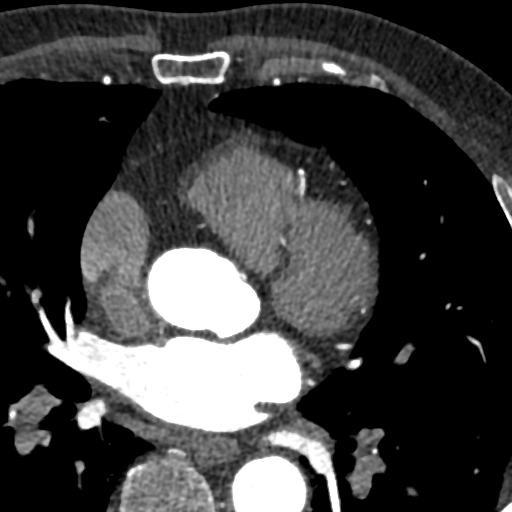

[Series 7: best syst 37 % · axial · 0.39mm/px · z∈[+1243,+1282]mm · 2 of 296 slices shown]
[im 99/296  vessel]
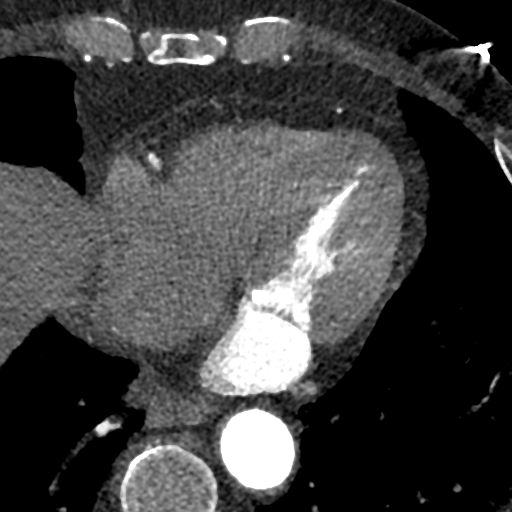
[im 197/296  vessel]
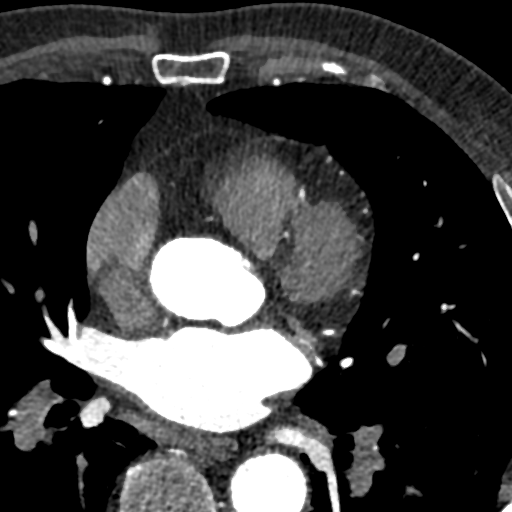

[Series 8: ts diast sharp 75 % · axial · 0.39mm/px · z∈[+1243,+1282]mm · 2 of 296 slices shown]
[im 99/296  lung]
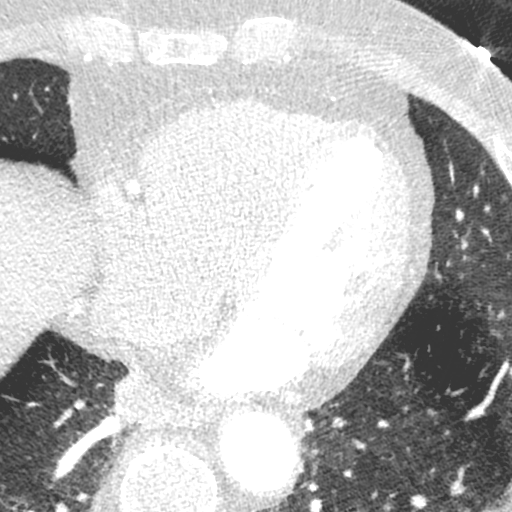
[im 197/296  lung]
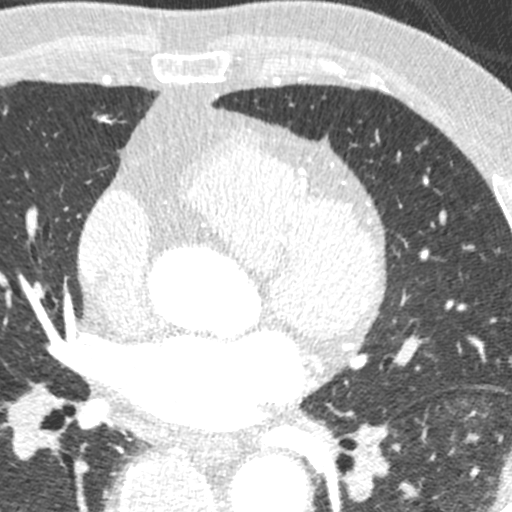

[Series 9: ts syst sharp 37 % · axial · 0.39mm/px · z∈[+1243,+1282]mm · 2 of 296 slices shown]
[im 99/296  lung]
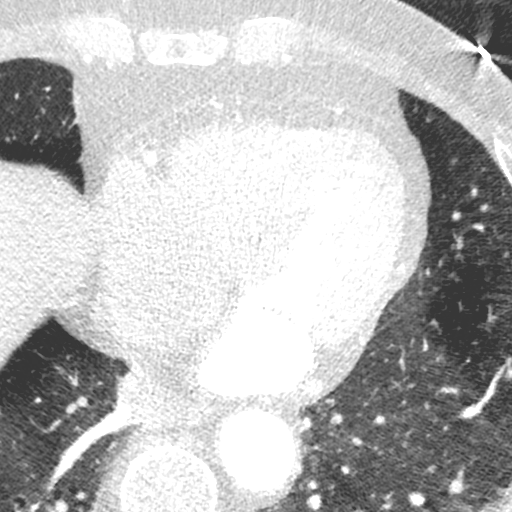
[im 197/296  lung]
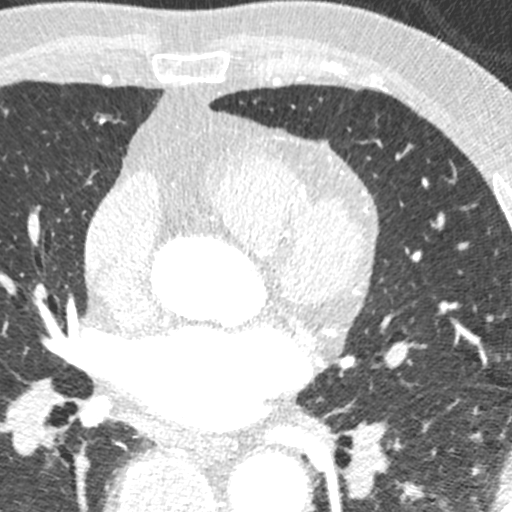

[8 of 20 positions shown; findings below may reference images not displayed]

FINDINGS: Limited view of the lung parenchyma demonstrates no suspicious
nodularity. Airways are normal.

Limited view of the mediastinum demonstrates no adenopathy.
Esophagus normal.

Limited view of the upper abdomen unremarkable.

Limited view of the skeleton and chest wall is unremarkable.
IMPRESSION: No significant extracardiac findings.
FINDINGS: A 120 kV prospective scan was triggered in the descending thoracic
aorta at 111 HU's. Axial non-contrast 3 mm slices were carried out
through the heart. The data set was analyzed on a dedicated work
station and scored using the Agatson method. Gantry rotation speed
was 250 msecs and collimation was .6 mm. No beta blockade and 0.8 mg
of sl NTG was given. The 3D data set was reconstructed in 5%
intervals of the 67-82 % of the R-R cycle. Diastolic phases were
analyzed on a dedicated work station using MPR, MIP and VRT modes.
The patient received 80 cc of contrast.

Aorta: Normal size. Ascending aorta 3.6 cm. Calcification of the
aortic root and descending aorta. No dissection.

Aortic Valve:  Trileaflet.  No calcifications.

Coronary Arteries:  Normal coronary origin.  Right dominance.

RCA is a large dominant artery that gives rise to PDA and 2 POLONIA
branches. There is 30-40% soft plaque at the ostium of the RCA,
20-30% mixed plaque in the proximal to mid RCA, and 40-50% soft
attenuation plaque in the distal RCA just prior to the PDA.

Left main is a short artery that gives rise to LAD and LCX arteries.
Heavily calcified. Stenosis severity difficult to assess due to
blooming artifact. Likely 20-30%.

LAD is a large vessel that 50-60% soft attenuation plaque in the
ostium followed by a heavily calcified proximal LAD concerning for
severe obstruction (>70%). There is 20-30% soft plaque in the mid
LAD. There is a branching D1 with 60-70% calcified plaque in the
ostial and proximal vessel.

LCX is a non-dominant artery that gives rise to 2 large OM branches.
There is 20-30% soft plaque in LCX near OM2. OM1 has 30-40%
calcified plaque proximally. OM2 has 20-30% calcified plaque.

Other findings:

Normal pulmonary vein drainage into the left atrium.

Normal let atrial appendage without a thrombus.

Normal size of the pulmonary artery.
IMPRESSION: 1. Coronary calcium score of 743. This was 62nd percentile for age
and sex matched control.

2. Normal coronary origin with right dominance.

3. The left main and proximal LAD are heavily calcified and
assessment of severity is limited by blooming artifact. There is
concern for obstructive disease in the proximal LAD.

4.  Non-obstructive CAD in the RCA and LCX territories.

5.  Recommend cardiac catheterization.

*** End of Addendum ***
Addendum:
EXAM:
OVER-READ INTERPRETATION  CT CHEST

The following report is an over-read performed by radiologist Dr.
Scheibelhoffer Vanda Jedlinszki [REDACTED] on 02/18/2019. This
over-read does not include interpretation of cardiac or coronary
anatomy or pathology. The coronary calcium score interpretation by
the cardiologist is attached.
FINDINGS: Limited view of the lung parenchyma demonstrates no suspicious
nodularity. Airways are normal.

Limited view of the mediastinum demonstrates no adenopathy.
Esophagus normal.

Limited view of the upper abdomen unremarkable.

Limited view of the skeleton and chest wall is unremarkable.
IMPRESSION: No significant extracardiac findings.
FINDINGS: A 120 kV prospective scan was triggered in the descending thoracic
aorta at 111 HU's. Axial non-contrast 3 mm slices were carried out
through the heart. The data set was analyzed on a dedicated work
station and scored using the Agatson method. Gantry rotation speed
was 250 msecs and collimation was .6 mm. No beta blockade and 0.8 mg
of sl NTG was given. The 3D data set was reconstructed in 5%
intervals of the 67-82 % of the R-R cycle. Diastolic phases were
analyzed on a dedicated work station using MPR, MIP and VRT modes.
The patient received 80 cc of contrast.

Aorta: Normal size. Ascending aorta 3.6 cm. Calcification of the
aortic root and descending aorta. No dissection.

Aortic Valve:  Trileaflet.  No calcifications.

Coronary Arteries:  Normal coronary origin.  Right dominance.

RCA is a large dominant artery that gives rise to PDA and 2 POLONIA
branches. There is 30-40% soft plaque at the ostium of the RCA,
20-30% mixed plaque in the proximal to mid RCA, and 40-50% soft
attenuation plaque in the distal RCA just prior to the PDA.

Left main is a short artery that gives rise to LAD and LCX arteries.
Heavily calcified. Stenosis severity difficult to assess due to
blooming artifact. Likely 20-30%.

LAD is a large vessel that 50-60% soft attenuation plaque in the
ostium followed by a heavily calcified proximal LAD concerning for
severe obstruction (>70%). There is 20-30% soft plaque in the mid
LAD. There is a branching D1 with 60-70% calcified plaque in the
ostial and proximal vessel.

LCX is a non-dominant artery that gives rise to 2 large OM branches.
There is 20-30% soft plaque in LCX near OM2. OM1 has 30-40%
calcified plaque proximally. OM2 has 20-30% calcified plaque.

Other findings:

Normal pulmonary vein drainage into the left atrium.

Normal let atrial appendage without a thrombus.

Normal size of the pulmonary artery.
IMPRESSION: 1. Coronary calcium score of 743. This was 62nd percentile for age
and sex matched control.

2. Normal coronary origin with right dominance.

3. The left main and proximal LAD are heavily calcified and
assessment of severity is limited by blooming artifact. There is
concern for obstructive disease in the proximal LAD.

4.  Non-obstructive CAD in the RCA and LCX territories.

5.  Recommend cardiac catheterization.

*** End of Addendum ***
EXAM:
OVER-READ INTERPRETATION  CT CHEST

The following report is an over-read performed by radiologist Dr.
Scheibelhoffer Vanda Jedlinszki [REDACTED] on 02/18/2019. This
over-read does not include interpretation of cardiac or coronary
anatomy or pathology. The coronary calcium score interpretation by
the cardiologist is attached.
FINDINGS: Limited view of the lung parenchyma demonstrates no suspicious
nodularity. Airways are normal.

Limited view of the mediastinum demonstrates no adenopathy.
Esophagus normal.

Limited view of the upper abdomen unremarkable.

Limited view of the skeleton and chest wall is unremarkable.
IMPRESSION: No significant extracardiac findings.

## 2020-07-06 ENCOUNTER — Ambulatory Visit: Payer: Medicare Other | Admitting: Cardiovascular Disease

## 2020-07-06 ENCOUNTER — Encounter: Payer: Self-pay | Admitting: Cardiovascular Disease

## 2020-07-06 ENCOUNTER — Other Ambulatory Visit: Payer: Self-pay

## 2020-07-06 VITALS — BP 138/68 | HR 57 | Ht 71.0 in | Wt 175.4 lb

## 2020-07-06 DIAGNOSIS — I25118 Atherosclerotic heart disease of native coronary artery with other forms of angina pectoris: Secondary | ICD-10-CM | POA: Diagnosis not present

## 2020-07-06 DIAGNOSIS — E7800 Pure hypercholesterolemia, unspecified: Secondary | ICD-10-CM

## 2020-07-06 DIAGNOSIS — I10 Essential (primary) hypertension: Secondary | ICD-10-CM | POA: Diagnosis not present

## 2020-07-06 MED ORDER — NITROGLYCERIN 0.4 MG SL SUBL: 0.4000 mg | SUBLINGUAL_TABLET | SUBLINGUAL | 2 refills | Status: AC | PRN

## 2020-07-06 NOTE — Progress Notes (Signed)
Cardiology Office Note    Date:  07/06/2020   ID:  Jeremy Cortez, DOB 07/02/1935, MRN [id number]  PCP:  Prince Solian, MD  Cardiologist:  Sanda Klein, MD  Electrophysiologist:  None   Evaluation Performed:  Follow-Up Visit  Chief Complaint:  CAD f/u  History of Present Illness:    Jeremy Cortez is a 85 y.o. male with coronary artery disease.  (angiography February 2020 with 80-90% stenoses in relatively small branches of a large first diagonal artery; scattered 30-50% stenoses in other major coronary arteries), normal left ventricular systolic function and no symptoms of congestive heart failure.  Continues to do his own yardwork, played golf three times this week, feels great. Carries NTG, but hasn't used it in months.   The patient specifically denies any chest pain at rest exertion, dyspnea at rest or with exertion, orthopnea, paroxysmal nocturnal dyspnea, syncope, palpitations, focal neurological deficits, intermittent claudication, lower extremity edema, unexplained weight gain, cough, hemoptysis or wheezing.   Past Medical History:  Diagnosis Date  . Abnormal PSA   . Bundle branch block, right   . Cataract   . Colon polyps    adenomatous  . Elevated prostate specific antigen (PSA)   . Erectile dysfunction   . Hypothyroidism   . Impaired glucose tolerance   . Migraine   . Nephrolithiasis    Past Surgical History:  Procedure Laterality Date  . cataract surgery    . EYE SURGERY  2008  . LEFT HEART CATH AND CORONARY ANGIOGRAPHY N/A 02/18/2019   Procedure: LEFT HEART CATH AND CORONARY ANGIOGRAPHY;  Surgeon: Troy Sine, MD;  Location: [facility];  Service: Cardiovascular;  Laterality: N/A;  . REFRACTIVE SURGERY     01/13/2013     Current Meds  Medication Sig  . acetaminophen (TYLENOL) 500 MG tablet Take 500-1,000 mg by mouth every 8 (eight) hours as needed (FOR BACK PAIN).  Marland Kitchen amLODipine (NORVASC) 5 MG tablet Take 1 tablet (5 mg total) by mouth  daily.  Marland Kitchen aspirin 81 MG tablet Take 81 mg by mouth at bedtime.   Marland Kitchen atorvastatin (LIPITOR) 80 MG tablet Take 1 tablet (80 mg total) by mouth daily at 6 PM.  . Lecithin 1200 MG CAPS Take 2,400 mg by mouth daily.   Marland Kitchen levothyroxine (SYNTHROID, LEVOTHROID) 75 MCG tablet Take 75 mcg by mouth daily before breakfast.  . metFORMIN (GLUCOPHAGE) 500 MG tablet Take 1 tablet (500 mg total) by mouth 2 (two) times daily with a meal.  . metoprolol succinate (TOPROL-XL) 25 MG 24 hr tablet Take 0.5 tablets (12.5 mg total) by mouth daily.  . nitroGLYCERIN (NITROSTAT) 0.4 MG SL tablet Place 1 tablet (0.4 mg total) under the tongue every 5 (five) minutes as needed for chest pain.  . Omega-3 Fatty Acids (FISH OIL PO) Take 2 capsules by mouth daily with breakfast.      Allergies:   Patient has no known allergies.   Social History   Tobacco Use  . Smoking status: Never Smoker  . Smokeless tobacco: Never Used  Substance Use Topics  . Alcohol use: Yes    Comment: wine occasionally  . Drug use: No     Family Hx: The patient's family history includes Benign prostatic hyperplasia in his brother; CAD in his brother; Muscular dystrophy in his father; Peripheral vascular disease in his brother; Prostate cancer in his brother; Ulcers in his father. There is no history of Colon cancer, Esophageal cancer, Rectal cancer, or Stomach cancer.  ROS:   Please see the history of present illness.    All other systems are reviewed and are negative.  Prior CV studies:   The following studies were reviewed today:  Coronary angiography February 18, 2019  Labs/Other Tests and Data Reviewed:    EKG:  Ordered today shows sinus bradycardia and old RBBB. QTc 439 ms.  Recent Labs: No results found for requested labs within last 8760 hours.  04/04/2020 Creat 0.8, A1c 6%, K 4.1, normal LFTs Recent Lipid Panel Lab Results  Component Value Date/Time   CHOL 117 02/18/2019 03:53 AM   TRIG 95 02/18/2019 03:53 AM   HDL 39 (L)  02/18/2019 03:53 AM   CHOLHDL 3.0 02/18/2019 03:53 AM   LDLCALC 59 02/18/2019 03:53 AM  04/14/202 TC 105, HDL 37, LDL 59, TG 44  Wt Readings from Last 3 Encounters:  07/06/20 175 lb 6.4 oz (79.6 kg)  04/26/19 175 lb (79.4 kg)  02/19/19 179 lb 4.8 oz (81.3 kg)    Objective:    Vital Signs:  BP 138/68   Pulse (!) 57   Ht 5\' 11"  (1.803 m)   Wt 175 lb 6.4 oz (79.6 kg)   SpO2 96%   BMI 24.46 kg/m     General: Alert, oriented x3, no distress, lean and fit Head: no evidence of trauma, PERRL, EOMI, no exophtalmos or lid lag, no myxedema, no xanthelasma; normal ears, nose and oropharynx Neck: normal jugular venous pulsations and no hepatojugular reflux; brisk carotid pulses without delay and no carotid bruits Chest: clear to auscultation, no signs of consolidation by percussion or palpation, normal fremitus, symmetrical and full respiratory excursions Cardiovascular: normal position and quality of the apical impulse, regular rhythm, normal first and widely split second heart sounds, no murmurs, rubs or gallops Abdomen: no tenderness or distention, no masses by palpation, no abnormal pulsatility or arterial bruits, normal bowel sounds, no hepatosplenomegaly Extremities: no clubbing, cyanosis or edema; 2+ radial, ulnar and brachial pulses bilaterally; 2+ right femoral, posterior tibial and dorsalis pedis pulses; 2+ left femoral, posterior tibial and dorsalis pedis pulses; no subclavian or femoral bruits Neurological: grossly nonfocal Psych: Normal mood and affect   ASSESSMENT & PLAN:    1. Coronary artery disease of native artery of native heart with stable angina pectoris ([place])   2. Hypercholesterolemia   3. Essential hypertension      1. CAD: Well controlled, essentially asymptomatic on 2 antianginal meds. 2. HLP: LDL is excellent, HDL borderline low. 3. HTN: Acceptable blood pressure control.   COVID-19 Education: The signs and symptoms of COVID-19 were discussed with the  patient and how to seek care for testing (follow up with PCP or arrange E-visit).  The importance of social distancing was discussed today.  Time:   Today, I have spent 14 minutes with the patient with telehealth technology discussing the above problems.     Medication Adjustments/Labs and Tests Ordered: Current medicines are reviewed at length with the patient today.  Concerns regarding medicines are outlined above.   Tests Ordered: No orders of the defined types were placed in this encounter.   Medication Changes: No orders of the defined types were placed in this encounter.   Disposition:  Follow up 12 months  Signed, Sanda Klein, MD  07/06/2020 1:29 PM    [facility]

## 2020-07-06 NOTE — Patient Instructions (Signed)
Medication Instructions:  No changes *If you need a refill on your cardiac medications before your next appointment, please call your pharmacy*   Lab Work: None orderd If you have labs (blood work) drawn today and your tests are completely normal, you will receive your results only by:  [place] (if you have MyChart) OR  A paper copy in the mail If you have any lab test that is abnormal or we need to change your treatment, we will call you to review the results.   Testing/Procedures: None ordered   Follow-Up: At [facility], you and your health needs are our priority.  As part of our continuing mission to provide you with exceptional heart care, we have created designated Provider Care Teams.  These Care Teams include your primary Cardiologist (physician) and Advanced Practice Providers (APPs -  Physician Assistants and Nurse Practitioners) who all work together to provide you with the care you need, when you need it.  We recommend signing up for the patient portal called "MyChart".  Sign up information is provided on this After Visit Summary.  MyChart is used to connect with patients for Virtual Visits (Telemedicine).  Patients are able to view lab/test results, encounter notes, upcoming appointments, etc.  Non-urgent messages can be sent to your provider as well.   To learn more about what you can do with MyChart, go to [email or web address].    Your next appointment:   12 month(s)  The format for your next appointment:   In Person  Provider:   You may see Sanda Klein, MD or one of the following Advanced Practice Providers on your designated Care Team:    Almyra Deforest, PA-C  Fabian Sharp, PA-C or   Roby Lofts, [place]

## 2021-11-20 ENCOUNTER — Encounter: Payer: Self-pay | Admitting: Cardiovascular Disease

## 2021-11-20 ENCOUNTER — Other Ambulatory Visit: Payer: Self-pay

## 2021-11-20 ENCOUNTER — Ambulatory Visit: Payer: Medicare Other | Admitting: Cardiovascular Disease

## 2021-11-20 VITALS — BP 124/62 | HR 58 | Ht 71.0 in | Wt 185.4 lb

## 2021-11-20 DIAGNOSIS — I10 Essential (primary) hypertension: Secondary | ICD-10-CM | POA: Diagnosis not present

## 2021-11-20 DIAGNOSIS — E7800 Pure hypercholesterolemia, unspecified: Secondary | ICD-10-CM | POA: Diagnosis not present

## 2021-11-20 DIAGNOSIS — I25118 Atherosclerotic heart disease of native coronary artery with other forms of angina pectoris: Secondary | ICD-10-CM

## 2021-11-20 NOTE — Progress Notes (Signed)
Cardiology Office Note    Date:  11/24/2021   ID:  Jeremy Cortez, DOB June 24, 1935, MRN [id number]  PCP:  Jeremy Solian, MD  Cardiologist:  Jeremy Klein, MD  Electrophysiologist:  None   Evaluation Performed:  Follow-Up Visit  Chief Complaint:  CAD f/u  History of Present Illness:    Jeremy Cortez is a 86 y.o. male with coronary artery disease.  (angiography February 2020 with 80-90% stenoses in relatively small branches of a large first diagonal artery; scattered 30-50% stenoses in other major coronary arteries), normal left ventricular systolic function and no symptoms of congestive heart failure.  Jeremy Cortez continues to do well.  He plays golf at least twice a week.  He does not have any angina pectoris on the current medications.  Interestingly, all his previous problems with acid reflux have also resolved, making me wonder whether his "reflux" actually represented angina that has resolved with appropriate medications.  He has not taken nitroglycerin in years.  The patient specifically denies any chest pain at rest or with exertion, dyspnea at rest or with exertion, orthopnea, paroxysmal nocturnal dyspnea, syncope, palpitations, focal neurological deficits, intermittent claudication, lower extremity edema, unexplained weight gain, cough, hemoptysis or wheezing.  He has gained 10 pounds over the last year, but maintains an almost optimal BMI at 25.8.  Past Medical History:  Diagnosis Date   Abnormal PSA    Bundle branch block, right    Cataract    Colon polyps    adenomatous   Elevated prostate specific antigen (PSA)    Erectile dysfunction    Hypothyroidism    Impaired glucose tolerance    Migraine    Nephrolithiasis    Past Surgical History:  Procedure Laterality Date   cataract surgery     EYE SURGERY  2008   LEFT HEART CATH AND CORONARY ANGIOGRAPHY N/A 02/18/2019   Procedure: LEFT HEART CATH AND CORONARY ANGIOGRAPHY;  Surgeon: Troy Sine, MD;  Location:  [place] [facility];  Service: Cardiovascular;  Laterality: N/A;   REFRACTIVE SURGERY     01/13/2013     Current Meds  Medication Sig   acetaminophen (TYLENOL) 500 MG tablet Take 500-1,000 mg by mouth every 8 (eight) hours as needed (FOR BACK PAIN).   amLODipine (NORVASC) 5 MG tablet Take 1 tablet (5 mg total) by mouth daily.   aspirin 81 MG tablet Take 81 mg by mouth at bedtime.    atorvastatin (LIPITOR) 80 MG tablet Take 1 tablet (80 mg total) by mouth daily at 6 PM.   Lecithin 1200 MG CAPS Take 2,400 mg by mouth daily.    levothyroxine (SYNTHROID, LEVOTHROID) 75 MCG tablet Take 75 mcg by mouth daily before breakfast.   metFORMIN (GLUCOPHAGE) 500 MG tablet Take 1 tablet (500 mg total) by mouth 2 (two) times daily with a meal.   metoprolol succinate (TOPROL-XL) 25 MG 24 hr tablet Take 0.5 tablets (12.5 mg total) by mouth daily.   nitroGLYCERIN (NITROSTAT) 0.4 MG SL tablet Place 1 tablet (0.4 mg total) under the tongue every 5 (five) minutes as needed for chest pain.   Omega-3 Fatty Acids (FISH OIL PO) Take 2 capsules by mouth daily with breakfast.      Allergies:   Patient has no known allergies.   Social History   Tobacco Use   Smoking status: Never   Smokeless tobacco: Never  Substance Use Topics   Alcohol use: Yes    Comment: wine occasionally   Drug use: No  Family Hx: The patient's family history includes Benign prostatic hyperplasia in his brother; CAD in his brother; Muscular dystrophy in his father; Peripheral vascular disease in his brother; Prostate cancer in his brother; Ulcers in his father. There is no history of Colon cancer, Esophageal cancer, Rectal cancer, or Stomach cancer.  ROS:   Please see the history of present illness.    All other systems are reviewed and are negative.  Prior CV studies:   The following studies were reviewed today:  Coronary angiography February 18, 2019  Labs/Other Tests and Data Reviewed:    EKG: Ordered today and personally  reviewed, shows sinus bradycardia at 58 bpm, old RBBB, QTC 455 ms, otherwise normal.  Recent Labs: No results found for requested labs within last 8760 hours.  04/04/2020 Creat 0.8, A1c 6%, K 4.1, normal LFTs 04/09/2021 Hemoglobin A1c 7.4%, hemoglobin 13.8, potassium 4.6, creatinine 0.8  Recent Lipid Panel Lab Results  Component Value Date/Time   CHOL 117 02/18/2019 03:53 AM   TRIG 95 02/18/2019 03:53 AM   HDL 39 (L) 02/18/2019 03:53 AM   CHOLHDL 3.0 02/18/2019 03:53 AM   LDLCALC 59 02/18/2019 03:53 AM  04/14/202 TC 105, HDL 37, LDL 59, TG 44  04/09/2021 TC 105, HDL 43, LDL 53, triglycerides 44  Wt Readings from Last 3 Encounters:  11/20/21 185 lb 6.4 oz (84.1 kg)  07/06/20 175 lb 6.4 oz (79.6 kg)  04/26/19 175 lb (79.4 kg)    Objective:    Vital Signs:  BP 124/62   Pulse (!) 58   Ht 5\' 11"  (1.803 m)   Wt 185 lb 6.4 oz (84.1 kg)   SpO2 94%   BMI 25.86 kg/m     General: Alert, oriented x3, no distress, lean and fit Head: no evidence of trauma, PERRL, EOMI, no exophtalmos or lid lag, no myxedema, no xanthelasma; normal ears, nose and oropharynx Neck: normal jugular venous pulsations and no hepatojugular reflux; brisk carotid pulses without delay and no carotid bruits Chest: clear to auscultation, no signs of consolidation by percussion or palpation, normal fremitus, symmetrical and full respiratory excursions Cardiovascular: normal position and quality of the apical impulse, regular rhythm, normal first and widely split second heart sounds, no murmurs, rubs or gallops Abdomen: no tenderness or distention, no masses by palpation, no abnormal pulsatility or arterial bruits, normal bowel sounds, no hepatosplenomegaly Extremities: no clubbing, cyanosis or edema; 2+ radial, ulnar and brachial pulses bilaterally; 2+ right femoral, posterior tibial and dorsalis pedis pulses; 2+ left femoral, posterior tibial and dorsalis pedis pulses; no subclavian or femoral bruits Neurological:  grossly nonfocal Psych: Normal mood and affect   ASSESSMENT & PLAN:    1. Coronary artery disease of native artery of native heart with stable angina pectoris ([place])   2. Hypercholesterolemia   3. Essential hypertension       CAD: Excellent control of symptoms on antianginal therapy with calcium channel blocker and beta-blocker.  Asymptomatic.   HLP: Excellent LDL cholesterol.  Even HDL has improved, even though he is gaining weight. HTN: Well-controlled.  He should avoid gaining any additional weight.  Patient Instructions  Medication Instructions:  No changes *If you need a refill on your cardiac medications before your next appointment, please call your pharmacy*   Lab Work: None ordered If you have labs (blood work) drawn today and your tests are completely normal, you will receive your results only by: [place] (if you have MyChart) OR A paper copy in the mail If you have  any lab test that is abnormal or we need to change your treatment, we will call you to review the results.   Testing/Procedures: None ordered   Follow-Up: At [facility], you and your health needs are our priority.  As part of our continuing mission to provide you with exceptional heart care, we have created designated Provider Care Teams.  These Care Teams include your primary Cardiologist (physician) and Advanced Practice Providers (APPs -  Physician Assistants and Nurse Practitioners) who all work together to provide you with the care you need, when you need it.  We recommend signing up for the patient portal called "MyChart".  Sign up information is provided on this After Visit Summary.  MyChart is used to connect with patients for Virtual Visits (Telemedicine).  Patients are able to view lab/test results, encounter notes, upcoming appointments, etc.  Non-urgent messages can be sent to your provider as well.   To learn more about what you can do with MyChart, go to [email or web address].     Your next appointment:   12 month(s)  The format for your next appointment:   In Person  Provider:   Sanda Klein, MD     Signed, Jeremy Klein, MD  11/24/2021 4:40 PM    [place]

## 2021-11-20 NOTE — Patient Instructions (Signed)

## 2023-01-13 ENCOUNTER — Other Ambulatory Visit: Payer: Self-pay | Admitting: Internal Medicine

## 2023-01-13 DIAGNOSIS — R1011 Right upper quadrant pain: Secondary | ICD-10-CM

## 2023-01-15 ENCOUNTER — Other Ambulatory Visit: Payer: Self-pay | Admitting: Internal Medicine

## 2023-01-15 DIAGNOSIS — R1011 Right upper quadrant pain: Secondary | ICD-10-CM

## 2023-01-27 ENCOUNTER — Ambulatory Visit
Admission: RE | Admit: 2023-01-27 | Discharge: 2023-01-27 | Disposition: A | Discharge status: Home / Self Care | Payer: Medicare Other | Source: Ambulatory Visit | Attending: Internal Medicine | Admitting: Internal Medicine

## 2023-01-27 DIAGNOSIS — R1011 Right upper quadrant pain: Secondary | ICD-10-CM

## 2023-01-27 MED ORDER — IOPAMIDOL (ISOVUE-300) INJECTION 61%
100.0000 mL | Freq: Once | INTRAVENOUS | Status: AC | PRN
  Administered 2023-01-27: 100 mL via INTRAVENOUS

## 2024-01-25 ENCOUNTER — Other Ambulatory Visit: Payer: Self-pay | Admitting: Registered Nurse

## 2024-01-25 DIAGNOSIS — R509 Fever, unspecified: Secondary | ICD-10-CM

## 2024-01-25 DIAGNOSIS — M5416 Radiculopathy, lumbar region: Secondary | ICD-10-CM

## 2024-01-25 DIAGNOSIS — R29898 Other symptoms and signs involving the musculoskeletal system: Secondary | ICD-10-CM

## 2024-01-26 ENCOUNTER — Ambulatory Visit
Admission: RE | Admit: 2024-01-26 | Discharge: 2024-01-26 | Disposition: A | Discharge status: Home / Self Care | Payer: Medicare Other | Source: Ambulatory Visit | Attending: Registered Nurse | Admitting: Registered Nurse

## 2024-01-26 ENCOUNTER — Telehealth: Payer: Self-pay | Admitting: Cardiovascular Disease

## 2024-01-26 DIAGNOSIS — M5416 Radiculopathy, lumbar region: Secondary | ICD-10-CM

## 2024-01-26 DIAGNOSIS — R29898 Other symptoms and signs involving the musculoskeletal system: Secondary | ICD-10-CM

## 2024-01-26 DIAGNOSIS — R509 Fever, unspecified: Secondary | ICD-10-CM

## 2024-01-26 MED ORDER — GADOPICLENOL 0.5 MMOL/ML IV SOLN
8.0000 mL | Freq: Once | INTRAVENOUS | Status: AC | PRN
  Administered 2024-01-26: 8 mL via INTRAVENOUS

## 2024-01-26 NOTE — Telephone Encounter (Signed)
 Patient identification verified by 2 forms. Bertina Cooks, RN    Called and spoke to patient  Patient states:   -he is having fevers, 30 minutes ago it was 100.6   -fever on going for 14 days   -has weakness in legs, has difficulty walking to mailbox   -symptoms initially started as back pain   -symptoms have progressed to weakness in legs and fevers   -blood testing at PCP office shows he has an infection   -PCP office is treating him for the infection but abx are note working   -believes issues are neurological related  -has a little bit of swelling in feet   -talks to PCP office daily   -had an appointment at PCP office last Thursday   -would like appointment with cardiology as this has been a few years  Patient denies:   -cough   -sore throat   -chest pain/chest pressure  Informed patient:   -Office visit scheduled for 2/7 to address swelling in feet   -best to continue to follow up with PCP regarding fever  Patient verbalized understanding, no questions at this time

## 2024-01-26 NOTE — Telephone Encounter (Signed)
 Pt called in stating he has been going to his PCP for back pain. They ran many test and everything came back normal. He states he cannot get control of his temperature. He keeps getting high fevers. He states his legs have been weak and noticed some swelling in his feet the last 3-4 days. He states he is not having any other cardiac symptoms (no SOB, CP, palpitations, syncope). Please advise.

## 2024-01-27 ENCOUNTER — Other Ambulatory Visit: Payer: Self-pay | Admitting: Registered Nurse

## 2024-01-27 ENCOUNTER — Ambulatory Visit
Admission: RE | Admit: 2024-01-27 | Discharge: 2024-01-27 | Disposition: A | Discharge status: Home / Self Care | Payer: Medicare Other | Source: Ambulatory Visit | Attending: Registered Nurse | Admitting: Registered Nurse

## 2024-01-27 DIAGNOSIS — R5383 Other fatigue: Secondary | ICD-10-CM

## 2024-01-27 DIAGNOSIS — R509 Fever, unspecified: Secondary | ICD-10-CM

## 2024-01-27 MED ORDER — IOPAMIDOL (ISOVUE-370) INJECTION 76%
500.0000 mL | Freq: Once | INTRAVENOUS | Status: AC | PRN
  Administered 2024-01-27: 75 mL via INTRAVENOUS

## 2024-01-27 NOTE — Progress Notes (Signed)
 Cardiology Office Note:  .   Date:  01/29/2024  ID:  Jeremy Cortez, DOB 23-Nov-1935, MRN [id number] PCP: Janey Santos, MD  [facility] Providers Cardiologist:  Jerel Balding, MD }   History of Present Illness: .   Jeremy Cortez is a 89 y.o. male with coronary artery disease. (angiography February 2020 with 80-90% stenoses in relatively small branches of a large first diagonal artery; scattered 30-50% stenoses in other major coronary arteries), normal left ventricular systolic function and no symptoms of congestive heart failure.  Seen by Dr. Balding on 11/20/2021 and was doing well with excellent control of symptoms hyperlipidemia and hypertension.  He comes today with complaints of swelling in his feet.  He states that the swelling in his feet has gotten better but he has not seen cardiology in a while so he wanted to keep the appointment.  The patient continues to have weakness in his back and lower legs which has significantly impacted his golf game.  He also has been running a fever for several months anywhere from 99.0-101.7 is being worked up by internal medicine.  The patient takes Tylenol  to assist with fever unknown etiology currently.  He has been medically compliant with antihypertensives as well as with diabetic diet.  He denies any chest pain, dyspnea on exertion, dizziness, or palpitations.  ROS: As above otherwise negative  Studies Reviewed: .     Echocardiogram 02/18/2019  1. The left ventricle has normal systolic function with an ejection  fraction of 60-65%. The cavity size was normal. Left ventricular diastolic  parameters were normal. No evidence of left ventricular regional wall  motion abnormalities.   2. The right ventricle has normal systolic function. The cavity was  normal. There is no increase in right ventricular wall thickness. Right  ventricular systolic pressure is normal with an estimated pressure of 24.2  mmHg.   3. Left atrial size was mildly  dilated.   4. There is mild mitral annular calcification present.   5. The aortic valve is normal in structure. Aortic valve regurgitation is  trivial by color flow Doppler.   6. The aortic root and ascending aorta are normal in size and structure.     EKG Interpretation Date/Time:    Ventricular Rate:    PR Interval:    QRS Duration:    QT Interval:    QTC Calculation:   R Axis:      Text Interpretation:      Physical Exam:   VS:  BP 134/62 (BP Location: Left Arm, Patient Position: Sitting, Cuff Size: Normal)   Pulse 64   Ht 5' 11 (1.803 m)   Wt 180 lb 3.2 oz (81.7 kg)   BMI 25.13 kg/m    Wt Readings from Last 3 Encounters:  01/29/24 180 lb 3.2 oz (81.7 kg)  11/20/21 185 lb 6.4 oz (84.1 kg)  07/06/20 175 lb 6.4 oz (79.6 kg)    GEN: Well nourished, well developed in no acute distress NECK: No JVD; No carotid bruits CARDIAC: RRR, soft systolic murmur, no rubs, gallops RESPIRATORY:  Clear to auscultation without rales, wheezing or rhonchi  ABDOMEN: Soft, non-tender, non-distended EXTREMITIES: Mild dependent bilateral lower extremity edema; No deformity   ASSESSMENT AND PLAN: .    Hypertension: Blood pressure is very well-controlled.  Some of his lower extremity edema is likely related to amlodipine .  I will not place him on any diuretic at this time.  Will repeat echocardiogram as 1 has not been completed  since 2020 to evaluate for change in LV function or worsening LVH.  2.  Coronary artery disease: The patient had 80 to 90% stenosis and a relatively small branch of the large first diagonal artery with scattered stenosis elsewhere.  Continue secondary prevention with blood pressure control, blood sugar control, statin therapy, and purposeful exercise with weight management.    3.  Hypercholesterolemia: Goal of LDL less than 100.  Most recent labs available in epic revealed a cholesterol 117 HDL 39 LDL 59.  These are followed by his primary care provider.  He will remain on  atorvastatin  80 mg daily.  He denies any myalgia symptoms.  4.  Hypothyroidism: He remains on levothyroxine  and is followed by primary care.      Signed, Lamarr HERO. Jerilynn CHOL, ANP, AACC

## 2024-01-28 ENCOUNTER — Other Ambulatory Visit: Payer: Medicare Other

## 2024-01-29 ENCOUNTER — Encounter: Payer: Self-pay | Admitting: Adult Health

## 2024-01-29 ENCOUNTER — Ambulatory Visit: Payer: Medicare Other | Attending: Adult Health | Admitting: Adult Health

## 2024-01-29 VITALS — BP 134/62 | HR 64 | Ht 71.0 in | Wt 180.2 lb

## 2024-01-29 DIAGNOSIS — R001 Bradycardia, unspecified: Secondary | ICD-10-CM | POA: Diagnosis not present

## 2024-01-29 DIAGNOSIS — R509 Fever, unspecified: Secondary | ICD-10-CM

## 2024-01-29 DIAGNOSIS — I10 Essential (primary) hypertension: Secondary | ICD-10-CM

## 2024-01-29 DIAGNOSIS — I2510 Atherosclerotic heart disease of native coronary artery without angina pectoris: Secondary | ICD-10-CM | POA: Diagnosis not present

## 2024-01-29 DIAGNOSIS — E7800 Pure hypercholesterolemia, unspecified: Secondary | ICD-10-CM | POA: Diagnosis not present

## 2024-01-29 DIAGNOSIS — E039 Hypothyroidism, unspecified: Secondary | ICD-10-CM

## 2024-01-29 DIAGNOSIS — I2089 Other forms of angina pectoris: Secondary | ICD-10-CM

## 2024-01-29 DIAGNOSIS — I25118 Atherosclerotic heart disease of native coronary artery with other forms of angina pectoris: Secondary | ICD-10-CM

## 2024-01-29 NOTE — Patient Instructions (Signed)
 Medication Instructions:  NO CHANGES *If you need a refill on your cardiac medications before your next appointment, please call your pharmacy*   Lab Work: NO LABS If you have labs (blood work) drawn today and your tests are completely normal, you will receive your results only by: MyChart [place] (if you have MyChart) OR A paper copy in the mail If you have any lab test that is abnormal or we need to change your treatment, we will call you to review the results.   Testing/Procedures:[address] Your physician has requested that you have an echocardiogram. Echocardiography is a painless test that uses sound waves to create images of your heart. It provides your doctor with information about the size and shape of your heart and how well your heart's chambers and valves are working. This procedure takes approximately one hour. There are no restrictions for this procedure. Please do NOT wear cologne, perfume, aftershave, or lotions (deodorant is allowed). Please arrive 15 minutes prior to your appointment time.  Please note: We ask at that you not bring children with you during ultrasound (echo/ vascular) testing. Due to room size and safety concerns, children are not allowed in the ultrasound rooms during exams. Our front office staff cannot provide observation of children in our lobby area while testing is being conducted. An adult accompanying a patient to their appointment will only be allowed in the ultrasound room at the discretion of the ultrasound technician under special circumstances. We apologize for any inconvenience.    Follow-Up: At [facility], you and your health needs are our priority.  As part of our continuing mission to provide you with exceptional heart care, we have created designated Provider Care Teams.  These Care Teams include your primary Cardiologist (physician) and Advanced Practice Providers (APPs -  Physician Assistants and Nurse  Practitioners) who all work together to provide you with the care you need, when you need it.  We recommend signing up for the patient portal called MyChart.  Sign up information is provided on this After Visit Summary.  MyChart is used to connect with patients for Virtual Visits (Telemedicine).  Patients are able to view lab/test results, encounter notes, upcoming appointments, etc.  Non-urgent messages can be sent to your provider as well.   To learn more about what you can do with MyChart, go to [email or web address].    Your next appointment:   4 month(s)  Provider:   Jerel Balding, MD   Other Instructions

## 2024-02-25 ENCOUNTER — Other Ambulatory Visit: Payer: Self-pay | Admitting: Registered Nurse

## 2024-02-25 ENCOUNTER — Ambulatory Visit
Admission: RE | Admit: 2024-02-25 | Discharge: 2024-02-25 | Disposition: A | Discharge status: Home / Self Care | Source: Ambulatory Visit | Attending: Registered Nurse | Admitting: Registered Nurse

## 2024-02-25 DIAGNOSIS — R509 Fever, unspecified: Secondary | ICD-10-CM

## 2024-03-02 ENCOUNTER — Ambulatory Visit (HOSPITAL_COMMUNITY)
Admission: RE | Admit: 2024-03-02 | Discharge: 2024-03-02 | Disposition: A | Discharge status: Home / Self Care | Payer: Medicare Other | Source: Ambulatory Visit | Attending: Adult Health | Admitting: Adult Health

## 2024-03-02 DIAGNOSIS — R509 Fever, unspecified: Secondary | ICD-10-CM | POA: Diagnosis present

## 2024-03-02 DIAGNOSIS — I10 Essential (primary) hypertension: Secondary | ICD-10-CM | POA: Diagnosis present

## 2024-03-02 LAB — ECHOCARDIOGRAM COMPLETE
AR max vel: 1.49 cm2
AV Area VTI: 1.41 cm2
AV Area mean vel: 1.35 cm2
AV Mean grad: 5 mmHg
AV Peak grad: 9.2 mmHg
Ao pk vel: 1.52 m/s
Area-P 1/2: 2.2 cm2
Calc EF: 53.6 %
MV M vel: 1.24 m/s
MV Peak grad: 6.2 mmHg
P 1/2 time: 591 ms
S' Lateral: 3.14 cm
Single Plane A2C EF: 62.8 %
Single Plane A4C EF: 33.1 %

## 2024-03-03 ENCOUNTER — Other Ambulatory Visit: Payer: Self-pay

## 2024-03-03 ENCOUNTER — Encounter: Payer: Self-pay | Admitting: Infectious Diseases

## 2024-03-03 ENCOUNTER — Ambulatory Visit: Payer: Medicare Other | Admitting: Infectious Diseases

## 2024-03-03 VITALS — BP 114/68 | HR 75 | Temp 98.0°F | Resp 16 | Ht 71.0 in | Wt 173.0 lb

## 2024-03-03 DIAGNOSIS — R29898 Other symptoms and signs involving the musculoskeletal system: Secondary | ICD-10-CM

## 2024-03-03 DIAGNOSIS — R531 Weakness: Secondary | ICD-10-CM | POA: Diagnosis not present

## 2024-03-03 DIAGNOSIS — R509 Fever, unspecified: Secondary | ICD-10-CM | POA: Diagnosis not present

## 2024-03-03 DIAGNOSIS — M48061 Spinal stenosis, lumbar region without neurogenic claudication: Secondary | ICD-10-CM | POA: Diagnosis not present

## 2024-03-03 NOTE — Progress Notes (Unsigned)
 error

## 2024-03-03 NOTE — Progress Notes (Unsigned)
 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   AMLODIPINE (NORVASC) 5 MG TABLET    Take 1 tablet (5 mg total) by mouth daily.   ASPIRIN 81 MG TABLET    Take 81 mg by mouth at bedtime.    ATORVASTATIN (LIPITOR) 80 MG TABLET    Take 1 tablet (80 mg total) by mouth daily at 6 PM.   CIPROFLOXACIN (CIPRO) 500 MG TABLET    Take 500 mg by mouth 2 (two) times daily.   GLUCOSE BLOOD (ONETOUCH VERIO) TEST STRIP    1 each by Other route as needed.   LECITHIN 1200 MG CAPS    Take 2,400 mg by mouth daily.    LEVOTHYROXINE (SYNTHROID, LEVOTHROID) 75 MCG TABLET    Take 75 mcg by mouth daily before breakfast.   METFORMIN (GLUCOPHAGE) 500 MG TABLET    Take 1 tablet (500 mg total) by mouth 2 (two) times daily with a meal.   METOPROLOL SUCCINATE (TOPROL-XL) 25 MG 24 HR TABLET    Take 0.5 tablets (12.5 mg total) by mouth daily.   NITROGLYCERIN (NITROSTAT) 0.4 MG SL TABLET    Place 1 tablet (0.4 mg total) under the tongue every 5 (five) minutes as needed for chest pain.   OMEGA-3 FATTY ACIDS (FISH OIL PO)    Take 2 capsules by mouth daily with breakfast.   Modified Medications   No medications on file  Discontinued Medications   No medications on file    Subjective: ***  Discussed the use of AI scribe software for clinical  note transcription with the patient, who gave verbal consent to proceed.   Review of Systems: ROS  Past Medical History:  Diagnosis Date   Abnormal PSA    Bundle branch block, right    Cataract    Colon polyps    adenomatous   Elevated prostate specific antigen (PSA)    Erectile dysfunction    Hypothyroidism    Impaired glucose tolerance    Migraine    Nephrolithiasis    Past Surgical History:  Procedure Laterality Date   cataract surgery     EYE SURGERY  2008   LEFT HEART CATH AND CORONARY ANGIOGRAPHY N/A 02/18/2019   Procedure: LEFT HEART CATH AND CORONARY ANGIOGRAPHY;  Surgeon: Lennette Bihari, MD;  Location: [facility];  Service: Cardiovascular;  Laterality: N/A;   REFRACTIVE SURGERY     01/13/2013     Social History   Tobacco Use   Smoking status: Never   Smokeless tobacco: Never  Substance Use Topics   Alcohol use: Yes    Comment: wine occasionally   Drug use: No    Family History  Problem Relation Age of Onset   Muscular dystrophy Father    Ulcers Father    Benign  prostatic hyperplasia Brother    Prostate cancer Brother    CAD Brother    Peripheral vascular disease Brother    Colon cancer Neg Hx    Esophageal cancer Neg Hx    Rectal cancer Neg Hx    Stomach cancer Neg Hx     No Known Allergies  Health Maintenance  Topic Date Due   Medicare Annual Wellness (AWV)  Never done   Pneumonia Vaccine 35+ Years old (1 of 2 - PCV) Never done   DTaP/Tdap/Td (1 - Tdap) Never done   Zoster Vaccines- Shingrix (1 of 2) Never done   Colonoscopy  01/29/2016   INFLUENZA VACCINE  Never done   COVID-19 Vaccine (3 - 2024-25 season) 08/23/2023   HPV VACCINES  Aged Out    Objective:  There were no vitals filed for this visit. There is no height or weight on file to calculate BMI.  Physical Exam Constitutional:      Appearance: Normal appearance.  HENT:     Head: Normocephalic and atraumatic.      Mouth: Mucous membranes are moist.  Eyes:     Conjunctiva/sclera: Conjunctivae normal.     Pupils: Pupils are equal, round, and reactive to light.   Cardiovascular:     Rate and Rhythm: Normal rate and regular rhythm.     Heart sounds: No murmur heard. No friction rub. No gallop.   Pulmonary:     Effort: Pulmonary effort is normal.     Breath sounds: Normal breath sounds.   Abdominal:     General: Non distended     Palpations: soft.   Musculoskeletal:        General: Normal range of motion.   Skin:    General: Skin is warm and dry.     Comments:  Neurological:     General: grossly non focal     Mental Status: awake, alert and oriented to person, place, and time.   Psychiatric:        Mood and Affect: Mood normal.   Lab Results Lab Results  Component Value Date   WBC 7.5 02/19/2019   HGB 14.6 02/19/2019   HCT 43.5 02/19/2019   MCV 93.3 02/19/2019   PLT 167 02/19/2019    Lab Results  Component Value Date   CREATININE 0.89 02/19/2019   BUN 12 02/19/2019   NA 137 02/19/2019   K 4.1 02/19/2019   CL 107 02/19/2019   CO2 23 02/19/2019    Lab Results  Component Value Date   ALT 16 02/19/2019   AST 17 02/19/2019   ALKPHOS 63 02/19/2019   BILITOT 0.9 02/19/2019    Lab Results  Component Value Date   CHOL 117 02/18/2019   HDL 39 (L) 02/18/2019   LDLCALC 59 02/18/2019   TRIG 95 02/18/2019   CHOLHDL 3.0 02/18/2019   No results found for: "LABRPR", "RPRTITER" No results found for: "HIV1RNAQUANT", "HIV1RNAVL", "CD4TABS"   Problem List Items Addressed This Visit   None   I have personally spent at least 60 minutes involved in face-to-face and non-face-to-face activities for this patient on the day of the visit. Professional time spent includes the following activities: Preparing to see the patient (review of tests), Obtaining and/or reviewing separately obtained history (admission/discharge record), Performing a medically appropriate examination and/or evaluation , Ordering medications/tests/procedures,  referring and communicating with other health care professionals, Documenting clinical information in the EMR, Independently interpreting results (not separately reported), Communicating results to the patient/family/caregiver, Counseling and educating the  patient/family/caregiver and Care coordination (not separately reported).   Of note, portions of this note may have been created with voice recognition software. While this note has been edited for accuracy, occasional wrong-word or 'sound-a-like' substitutions may have occurred due to the inherent limitations of voice recognition software.   Victoriano Lain, MD [facility] 03/03/2024, 8:12 AM

## 2024-03-03 NOTE — Progress Notes (Unsigned)
 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   AMLODIPINE (NORVASC) 5 MG TABLET    Take 1 tablet (5 mg total) by mouth daily.   ASPIRIN 81 MG TABLET    Take 81 mg by mouth at bedtime.    ATORVASTATIN (LIPITOR) 80 MG TABLET    Take 1 tablet (80 mg total) by mouth daily at 6 PM.   CIPROFLOXACIN (CIPRO) 500 MG TABLET    Take 500 mg by mouth 2 (two) times daily.   GLUCOSE BLOOD (ONETOUCH VERIO) TEST STRIP    1 each by Other route as needed.   LECITHIN 1200 MG CAPS    Take 2,400 mg by mouth daily.    LEVOTHYROXINE (SYNTHROID, LEVOTHROID) 75 MCG TABLET    Take 75 mcg by mouth daily before breakfast.   METFORMIN (GLUCOPHAGE) 500 MG TABLET    Take 1 tablet (500 mg total) by mouth 2 (two) times daily with a meal.   METOPROLOL SUCCINATE (TOPROL-XL) 25 MG 24 HR TABLET    Take 0.5 tablets (12.5 mg total) by mouth daily.   NITROGLYCERIN (NITROSTAT) 0.4 MG SL TABLET    Place 1 tablet (0.4 mg total) under the tongue every 5 (five) minutes as needed for chest pain.   OMEGA-3 FATTY ACIDS (FISH OIL PO)    Take 2 capsules by mouth daily with breakfast.   Modified Medications   No medications on file  Discontinued Medications   No medications on file    Subjective: ***  Discussed the use of AI scribe software for clinical note  transcription with the patient, who gave verbal consent to proceed.   Review of Systems: ROS  Past Medical History:  Diagnosis Date   Abnormal PSA    Bundle branch block, right    Cataract    Colon polyps    adenomatous   Elevated prostate specific antigen (PSA)    Erectile dysfunction    Hypothyroidism    Impaired glucose tolerance    Migraine    Nephrolithiasis    Past Surgical History:  Procedure Laterality Date   cataract surgery     EYE SURGERY  2008   LEFT HEART CATH AND CORONARY ANGIOGRAPHY N/A 02/18/2019   Procedure: LEFT HEART CATH AND CORONARY ANGIOGRAPHY;  Surgeon: Lennette Bihari, MD;  Location: [facility];  Service: Cardiovascular;  Laterality: N/A;   REFRACTIVE SURGERY     01/13/2013     Social History   Tobacco Use   Smoking status: Never   Smokeless tobacco: Never  Substance Use Topics   Alcohol use: Yes    Comment: wine occasionally   Drug use: No    Family History  Problem Relation Age of Onset   Muscular dystrophy Father    Ulcers Father    Benign prostatic hyperplasia Brother  Prostate cancer Brother    CAD Brother    Peripheral vascular disease Brother    Colon cancer Neg Hx    Esophageal cancer Neg Hx    Rectal cancer Neg Hx    Stomach cancer Neg Hx     No Known Allergies  Health Maintenance  Topic Date Due   Medicare Annual Wellness (AWV)  Never done   Pneumonia Vaccine 87+ Years old (1 of 2 - PCV) Never done   DTaP/Tdap/Td (1 - Tdap) Never done   Zoster Vaccines- Shingrix (1 of 2) Never done   Colonoscopy  01/29/2016   INFLUENZA VACCINE  Never done   COVID-19 Vaccine (3 - 2024-25 season) 08/23/2023   HPV VACCINES  Aged Out    Objective:  There were no vitals filed for this visit. There is no height or weight on file to calculate BMI.  Physical Exam Constitutional:      Appearance: Normal appearance.  HENT:     Head: Normocephalic and atraumatic.      Mouth: Mucous membranes are moist.  Eyes:     Conjunctiva/sclera: Conjunctivae normal.     Pupils: Pupils are equal, round, and reactive to light.   Cardiovascular:     Rate and Rhythm: Normal rate and regular rhythm.     Heart sounds: No murmur heard. No friction rub. No gallop.   Pulmonary:     Effort: Pulmonary effort is normal.     Breath sounds: Normal breath sounds.   Abdominal:     General: Non distended     Palpations: soft.   Musculoskeletal:        General: Normal range of motion.   Skin:    General: Skin is warm and dry.     Comments:  Neurological:     General: grossly non focal     Mental Status: awake, alert and oriented to person, place, and time.   Psychiatric:        Mood and Affect: Mood normal.   Lab Results Lab Results  Component Value Date   WBC 7.5 02/19/2019   HGB 14.6 02/19/2019   HCT 43.5 02/19/2019   MCV 93.3 02/19/2019   PLT 167 02/19/2019    Lab Results  Component Value Date   CREATININE 0.89 02/19/2019   BUN 12 02/19/2019   NA 137 02/19/2019   K 4.1 02/19/2019   CL 107 02/19/2019   CO2 23 02/19/2019    Lab Results  Component Value Date   ALT 16 02/19/2019   AST 17 02/19/2019   ALKPHOS 63 02/19/2019   BILITOT 0.9 02/19/2019    Lab Results  Component Value Date   CHOL 117 02/18/2019   HDL 39 (L) 02/18/2019   LDLCALC 59 02/18/2019   TRIG 95 02/18/2019   CHOLHDL 3.0 02/18/2019   No results found for: "LABRPR", "RPRTITER" No results found for: "HIV1RNAQUANT", "HIV1RNAVL", "CD4TABS"   Problem List Items Addressed This Visit   None   I have personally spent at least 60 minutes involved in face-to-face and non-face-to-face activities for this patient on the day of the visit. Professional time spent includes the following activities: Preparing to see the patient (review of tests), Obtaining and/or reviewing separately obtained history (admission/discharge record), Performing a medically appropriate examination and/or evaluation , Ordering medications/tests/procedures,  referring and communicating with other health care professionals, Documenting clinical information in the EMR, Independently interpreting results (not separately reported), Communicating results to the patient/family/caregiver, Counseling and educating the patient/family/caregiver and Care coordination (not separately  reported).   Of note, portions of this note may have been created with voice recognition software. While this note has been edited for accuracy, occasional wrong-word or 'sound-a-like' substitutions may have occurred due to the inherent limitations of voice recognition software.   Victoriano Lain, MD [facility] 03/03/2024, 8:42 AM

## 2024-03-04 DIAGNOSIS — R509 Fever, unspecified: Secondary | ICD-10-CM | POA: Insufficient documentation

## 2024-03-04 DIAGNOSIS — R29898 Other symptoms and signs involving the musculoskeletal system: Secondary | ICD-10-CM | POA: Insufficient documentation

## 2024-03-04 LAB — COMPREHENSIVE METABOLIC PANEL
AG Ratio: 1.2 (calc) (ref 1.0–2.5)
ALT: 32 U/L (ref 9–46)
AST: 27 U/L (ref 10–35)
Albumin: 2.8 g/dL — ABNORMAL LOW (ref 3.6–5.1)
Alkaline phosphatase (APISO): 92 U/L (ref 35–144)
BUN: 16 mg/dL (ref 7–25)
CO2: 24 mmol/L (ref 20–32)
Calcium: 8 mg/dL — ABNORMAL LOW (ref 8.6–10.3)
Chloride: 99 mmol/L (ref 98–110)
Creat: 0.75 mg/dL (ref 0.70–1.22)
Globulin: 2.4 g/dL (ref 1.9–3.7)
Glucose, Bld: 242 mg/dL — ABNORMAL HIGH (ref 65–99)
Potassium: 4.4 mmol/L (ref 3.5–5.3)
Sodium: 135 mmol/L (ref 135–146)
Total Bilirubin: 0.5 mg/dL (ref 0.2–1.2)
Total Protein: 5.2 g/dL — ABNORMAL LOW (ref 6.1–8.1)

## 2024-03-04 NOTE — Progress Notes (Addendum)
 Patient Active Problem List   Diagnosis Date Noted   Essential hypertension 07/06/2020   Coronary artery disease of native artery of native heart with stable angina pectoris (HCC) 04/26/2019   Sinus bradycardia 02/18/2019   Hypercholesterolemia 02/18/2019   Hypothyroidism 02/18/2019   Abnormal computed tomography angiography (CTA)    Stable angina (HCC) 02/17/2019   Pneumonia 05/15/2017   IRON DEFICIENCY ANEMIA SECONDARY TO BLOOD LOSS 01/14/2010   INTERNAL HEMORRHOIDS WITHOUT MENTION COMP 01/14/2010   Current Outpatient Medications on File Prior to Visit  Medication Sig Dispense Refill   acetaminophen (TYLENOL) 500 MG tablet Take 500-1,000 mg by mouth every 8 (eight) hours as needed (FOR BACK PAIN).     amLODipine (NORVASC) 5 MG tablet Take 1 tablet (5 mg total) by mouth daily. 30 tablet 1   aspirin 81 MG tablet Take 81 mg by mouth at bedtime.      atorvastatin (LIPITOR) 80 MG tablet Take 1 tablet (80 mg total) by mouth daily at 6 PM. 30 tablet 3   ciprofloxacin (CIPRO) 500 MG tablet Take 500 mg by mouth 2 (two) times daily.     glucose blood (ONETOUCH VERIO) test strip 1 each by Other route as needed.     Lecithin 1200 MG CAPS Take 2,400 mg by mouth daily.      metFORMIN (GLUCOPHAGE) 500 MG tablet Take 1 tablet (500 mg total) by mouth 2 (two) times daily with a meal. 60 tablet 1   nitroGLYCERIN (NITROSTAT) 0.4 MG SL tablet Place 1 tablet (0.4 mg total) under the tongue every 5 (five) minutes as needed for chest pain. 25 tablet 2   Omega-3 Fatty Acids (FISH OIL PO) Take 2 capsules by mouth daily with breakfast.      levothyroxine (SYNTHROID, LEVOTHROID) 75 MCG tablet Take 75 mcg by mouth daily before breakfast.     metoprolol succinate (TOPROL-XL) 25 MG 24 hr tablet Take 0.5 tablets (12.5 mg total) by mouth daily. 30 tablet 1   triamcinolone cream (KENALOG) 0.1 % Apply 1 Application topically 2 (two) times daily as needed. (Patient not taking: Reported on 03/03/2024)     No current  facility-administered medications on file prior to visit.   Subjective: Discussed the use of AI scribe software for clinical note transcription with the patient, who gave verbal consent to proceed.   89 year old with a history of arthritis, hypothyroidism, BPH, migrane, Kidney stones, HLD, DM2, CAD who is referred from PCP for evaluation of fever of unknown origin.   Patient is accompanied by wife and daughter. Reports intermittent fevers with occasional chills that have been occurring since January. He had some back pain before fevers started dec last year. The fevers fluctuate, with the highest recorded temperature being 101.63F. Takes tylenol at home.  Initially, he had to take Tylenol every six hours, but the frequency has since decreased. Denies night sweats.   In addition to the fevers, the patient has been experiencing weakness in his bilateral legs, which has affected his mobility. He reports difficulty walking and a loss of strength in his lower body. The weakness is more pronounced in the mornings and has been gradually improving.  The patient also reports frequent urination, which has been a long-standing issue but has worsened over the past two months. He denies any burning sensation during urination, blood in urine, suprapubic pain but reports a strong urge to urinate.  The patient has also noticed a loss of appetite and a slight weight loss. He reports a  loss of taste, particularly for sweet foods. He denies any nausea, vomiting, diarrhea, abdominal pain, headache, blurry vision, neck pain, joint pain, or back pain.  The patient's wife notes that the patient has been short of breath and has lost his core strength. Denies cough or chest pain.   He found 3 ticks in his body last summer which he pulled out and put in a bottle. Did not seek any medical attention.   He has completed 10 days of ciprofloxacin then 2 courses of 10 days doxycycline and a course of prednisone prescribed by PCP  since the onset of fevers.   Per patient report,  CT head, chest abdomen pelvis as well as MRI Lumbar spine was done with no significant findings ( able to see results of MRI L spine in outside records but no CT head, CAP).  Denies smoking, alcohol and recreational drugs. Denies being sexually active. Denies pets at home. Denies recent travel, sick contact, TB contacts. Denies unusual food intake/unpasteurized food.  Denies family h/o autoimmune diseases.  Review of Systems: all systems reviewed with pertinent positives and negatives as listed above  Past Medical History:  Diagnosis Date   Abnormal PSA    Bundle branch block, right    Cataract    Colon polyps    adenomatous   Elevated prostate specific antigen (PSA)    Erectile dysfunction    Hypothyroidism    Impaired glucose tolerance    Migraine    Nephrolithiasis    Past Surgical History:  Procedure Laterality Date   cataract surgery     EYE SURGERY  2008   LEFT HEART CATH AND CORONARY ANGIOGRAPHY N/A 02/18/2019   Procedure: LEFT HEART CATH AND CORONARY ANGIOGRAPHY;  Surgeon: Lennette Bihari, MD;  Location: [facility];  Service: Cardiovascular;  Laterality: N/A;   REFRACTIVE SURGERY     01/13/2013    Social History   Tobacco Use   Smoking status: Never   Smokeless tobacco: Never  Substance Use Topics   Alcohol use: Yes    Comment: wine occasionally   Drug use: No    Family History  Problem Relation Age of Onset   Muscular dystrophy Father    Ulcers Father    Benign prostatic hyperplasia Brother    Prostate cancer Brother    CAD Brother    Peripheral vascular disease Brother    Colon cancer Neg Hx    Esophageal cancer Neg Hx    Rectal cancer Neg Hx    Stomach cancer Neg Hx     No Known Allergies  Health Maintenance  Topic Date Due   Medicare Annual Wellness (AWV)  Never done   Pneumonia Vaccine 40+ Years old (1 of 2 - PCV) Never done   DTaP/Tdap/Td (1 - Tdap) Never done   Colonoscopy   01/29/2016   INFLUENZA VACCINE  07/23/2023   COVID-19 Vaccine (3 - 2024-25 season) 08/23/2023   Zoster Vaccines- Shingrix  Completed   HPV VACCINES  Aged Out    Objective:  Vitals:   03/03/24 0953  BP: 114/68  Pulse: 75  Resp: 16  Temp: 98 F (36.7 C)  TempSrc: Oral  SpO2: 98%  Weight: 173 lb (78.5 kg)  Height: 5\' 11"  (1.803 m)   Body mass index is 24.13 kg/m.  Physical Exam Constitutional:      Appearance: Normal appearance. No lymphadenopathy HENT:     Head: Normocephalic and atraumatic.      Mouth: Mucous membranes are moist.  Eyes:    Conjunctiva/sclera: Conjunctivae normal.     Pupils: Pupils are equal, round, and b/l symmetrical   Cardiovascular:     Rate and Rhythm: Normal rate and regular rhythm.     Heart sounds: s1s2  Pulmonary:     Effort: Pulmonary effort is normal.     Breath sounds: Normal breath sounds.   Abdominal:     General: Non distended     Palpations: soft.   Musculoskeletal:        General: Normal range of motion. Ambulatory  Skin:    General: Skin is warm and dry.     Comments:  Neurological:     General: grossly non focal     Mental Status: awake, alert and oriented to person, place, and time.   Psychiatric:        Mood and Affect: Mood normal.   Lab Results Lab Results  Component Value Date   WBC 10.4 03/03/2024   HGB 11.8 (L) 03/03/2024   HCT 36.0 (L) 03/03/2024   MCV 93.8 03/03/2024   PLT 372 03/03/2024    Lab Results  Component Value Date   CREATININE 0.75 03/03/2024   BUN 16 03/03/2024   NA 135 03/03/2024   K 4.4 03/03/2024   CL 99 03/03/2024   CO2 24 03/03/2024    Lab Results  Component Value Date   ALT 32 03/03/2024   AST 27 03/03/2024   ALKPHOS 63 02/19/2019   BILITOT 0.5 03/03/2024    Lab Results  Component Value Date   CHOL 117 02/18/2019   HDL 39 (L) 02/18/2019   LDLCALC 59 02/18/2019   TRIG 95 02/18/2019   CHOLHDL 3.0 02/18/2019   No results found for: "LABRPR", "RPRTITER" No results  found for: "HIV1RNAQUANT", "HIV1RNAVL", "CD4TABS"   Assessment/Plan # Fevers of unknown origin - I reviewed patients fever diary ( see media) where mostly temperatures are in the range 98-60F with some recorded at 101.2, 100F, 100.1. Wife thinks his normal temperature is 48F and anything 99 or above is fever for him. - He has no known immunocompromising conditions.  - Labs as below to start to look for infectious cause Orders Placed This Encounter  Procedures   Blood culture (routine single)   Blood culture (routine single)   Malaria Smear   Comprehensive metabolic panel   HIV Antibody (routine testing w rflx)   Sedimentation rate   C-reactive protein   Mononucleosis screen   CBC w/Diff   Urinalysis, Routine w reflex microscopic   Antinuclear Antib (ANA)   Rheumatoid Factor   Ferritin   Hepatitis, Acute   Serum protein electrophoresis with reflex  - Discussed other possible causes of fevers could be auto-immune causes as well as malignant causes ( given his age) and should fu with PCP for those - Fu in 3 weeks pending labs   # Lower extremity weakness/lumbar spinal stenosis - MRI L spine 01/27/24 01/27/24 multilevel lumbar spondylosis, moderate bilateral foraminal stenosis, no evidence of discitis/osteomyelitis - Fu with PCP  I have personally spent 75 involved in face-to-face and non-face-to-face activities for this patient on the day of the visit. Professional time spent includes the following activities: Preparing to see the patient (review of tests), Obtaining and/or reviewing separately obtained history (admission/discharge record), Performing a medically appropriate examination and/or evaluation , Ordering medications/tests/procedures, referring and communicating with other health care professionals, Documenting clinical information in the EMR, Independently interpreting results (not separately reported), Communicating results to the patient/family/caregiver, Counseling and educating  the patient/family/caregiver and Care coordination (not separately reported).   Of note, portions of this note may have been created with voice recognition software. While this note has been edited for accuracy, occasional wrong-word or 'sound-a-like' substitutions may have occurred due to the inherent limitations of voice recognition software.   Victoriano Lain, MD [facility] 03/04/2024, 7:47 AM

## 2024-03-04 NOTE — Addendum Note (Signed)
 Addended by: Odette Fraction on: 03/04/2024 08:49 AM   Modules accepted: Orders

## 2024-03-07 LAB — MALARIA SMEAR

## 2024-03-08 ENCOUNTER — Telehealth: Payer: Self-pay

## 2024-03-08 NOTE — Telephone Encounter (Addendum)
 Called patient regarding results. Left message for patient to call office.----- Message from Joylene Grapes sent at 03/04/2024 11:29 AM EDT ----- Covering Kathryn's inbox.  Recent echocardiogram showed overall normal heart pumping function.  The left bottom chamber of the heart is mildly stiff (grade 1 diastolic dysfunction, a common age-related finding in the setting of history of hypertension), there is mild leakiness of the aortic valve (mild aortic valve regurgitation).  Overall a stable report.  Continue current medications and follow-up as planned.  Thank you-EM

## 2024-03-09 LAB — PROTEIN ELECTROPHORESIS, SERUM, WITH REFLEX
Alpha 1: 2.5 g/dL — ABNORMAL LOW (ref 3.8–4.8)
Alpha 2: 0.9 g/dL — ABNORMAL HIGH (ref 0.5–0.9)
Beta Globulin: 0.3 g/dL — ABNORMAL LOW (ref 0.4–0.6)
Beta Globulin: 0.9 g/dL — ABNORMAL LOW (ref 0.5–0.9)
Gamma Globulin: 0.7 g/dL — ABNORMAL LOW (ref 0.8–1.7)
Gamma Globulin: 0.7 g/dL — ABNORMAL LOW (ref 0.8–1.7)
Total Protein: 5.3 g/dL — ABNORMAL LOW (ref 6.1–8.1)

## 2024-03-09 LAB — SEDIMENTATION RATE: Sed Rate: 28 mm/h — ABNORMAL HIGH (ref 0–20)

## 2024-03-09 LAB — COMPLETE METABOLIC PANEL WITH GFR
AG Ratio: 1.2 (calc) (ref 1.0–2.5)
ALT: 32 U/L (ref 9–46)
AST: 27 U/L (ref 10–35)
Albumin: 2.8 g/dL — ABNORMAL LOW (ref 3.6–5.1)
Alkaline phosphatase (APISO): 92 U/L (ref 35–144)
BUN: 16 mg/dL (ref 7–25)
CO2: 24 mmol/L (ref 20–32)
Calcium: 8 mg/dL — ABNORMAL LOW (ref 8.6–10.3)
Chloride: 99 mmol/L (ref 98–110)
Creat: 0.75 mg/dL (ref 0.70–1.22)
Globulin: 2.4 g/dL (ref 1.9–3.7)
Glucose, Bld: 242 mg/dL — ABNORMAL HIGH (ref 65–99)
Potassium: 4.4 mmol/L (ref 3.5–5.3)
Sodium: 135 mmol/L (ref 135–146)
Total Bilirubin: 0.5 mg/dL (ref 0.2–1.2)
Total Protein: 5.2 g/dL — ABNORMAL LOW (ref 6.1–8.1)

## 2024-03-09 LAB — CBC WITH DIFFERENTIAL/PLATELET
Absolute Lymphocytes: 1175 {cells}/uL (ref 850–3900)
Absolute Monocytes: 1321 {cells}/uL — ABNORMAL HIGH (ref 200–950)
Basophils Absolute: 62 {cells}/uL (ref 0–200)
Basophils Relative: 0.6 %
Eosinophils Absolute: 166 {cells}/uL (ref 15–500)
Eosinophils Relative: 1.6 %
HCT: 36 % — ABNORMAL LOW (ref 38.5–50.0)
Hemoglobin: 11.8 g/dL — ABNORMAL LOW (ref 13.2–17.1)
MCH: 30.7 pg (ref 27.0–33.0)
MCHC: 32.8 g/dL (ref 32.0–36.0)
MCV: 93.8 fL (ref 80.0–100.0)
MPV: 12.7 fL — ABNORMAL HIGH (ref 7.5–12.5)
Monocytes Relative: 12.7 %
Neutro Abs: 7675 {cells}/uL (ref 1500–7800)
Neutrophils Relative %: 73.8 %
Platelets: 372 10*3/uL (ref 140–400)
RBC: 3.84 10*6/uL — ABNORMAL LOW (ref 4.20–5.80)
RDW: 12.5 % (ref 11.0–15.0)
Total Lymphocyte: 11.3 %
WBC: 10.4 10*3/uL (ref 3.8–10.8)

## 2024-03-09 LAB — URINALYSIS, ROUTINE W REFLEX MICROSCOPIC
Bilirubin Urine: NEGATIVE
Hgb urine dipstick: NEGATIVE
Ketones, ur: NEGATIVE
Leukocytes,Ua: NEGATIVE
Nitrite: NEGATIVE
Protein, ur: NEGATIVE
Specific Gravity, Urine: 1.024 (ref 1.001–1.035)
pH: <=5 (ref 5.0–8.0)

## 2024-03-09 LAB — FERRITIN: Ferritin: 778 ng/mL — ABNORMAL HIGH (ref 24–380)

## 2024-03-09 LAB — ANTI-NUCLEAR AB-TITER (ANA TITER): ANA Titer 1: 1:320 {titer} — ABNORMAL HIGH

## 2024-03-09 LAB — HEPATITIS PANEL, ACUTE
Hep A IgM: NONREACTIVE
Hep B C IgM: NONREACTIVE
Hepatitis B Surface Ag: NONREACTIVE
Hepatitis C Ab: NONREACTIVE

## 2024-03-09 LAB — CULTURE, BLOOD (SINGLE)
MICRO NUMBER:: 16199284
MICRO NUMBER:: 16199283
Result:: NO GROWTH
Result:: NO GROWTH
SPECIMEN QUALITY:: ADEQUATE
SPECIMEN QUALITY:: ADEQUATE

## 2024-03-09 LAB — ANA: Anti Nuclear Antibody (ANA): POSITIVE — AB

## 2024-03-09 LAB — RHEUMATOID FACTOR: Rheumatoid fact SerPl-aCnc: 17 [IU]/mL — ABNORMAL HIGH (ref <14)

## 2024-03-09 LAB — MONONUCLEOSIS SCREEN: Heterophile, Mono Screen: NEGATIVE

## 2024-03-09 LAB — HIV ANTIBODY (ROUTINE TESTING W REFLEX): HIV 1&2 Ab, 4th Generation: NONREACTIVE

## 2024-03-09 LAB — IFE INTERPRETATION

## 2024-03-09 LAB — C-REACTIVE PROTEIN: CRP: 68.9 mg/L — ABNORMAL HIGH (ref <8.0)

## 2024-03-09 NOTE — Telephone Encounter (Signed)
 Patient identification verified by 2 forms. Marilynn Rail, RN    Called and spoke to patient  Relayed result message below  Patient aware to keep 6/6 OV with Dr. Royann Shivers  Patient verbalized understanding, no questions at this time

## 2024-03-09 NOTE — Telephone Encounter (Signed)
 Attempted to call patient, no answer left message requesting a call back.

## 2024-03-09 NOTE — Telephone Encounter (Signed)
 Pt returning nurse's call. Please advise

## 2024-03-09 NOTE — Telephone Encounter (Signed)
 Pt returning call

## 2024-03-11 ENCOUNTER — Telehealth: Payer: Self-pay

## 2024-03-11 NOTE — Telephone Encounter (Signed)
 Patient's wife called stating patient was seen by pcp today and he wanted them to reach out to our office regarding increase swelling in legs and feet.  They also request PT orders and I advised the wife PT orders will need to come from his pcp.  Patient's wife would also like to know about patient's labs.  Please advise Ravindra Baranek Jonathon Resides, CMA

## 2024-03-15 NOTE — Telephone Encounter (Signed)
 Patient's wife informed of lab results. She reports that he is seeing Dr. Dierdre Forth for Rheumatology Thursday. This will be their initial appointment with him

## 2024-04-05 ENCOUNTER — Other Ambulatory Visit: Payer: Self-pay

## 2024-04-05 ENCOUNTER — Ambulatory Visit: Admitting: Infectious Diseases

## 2024-04-05 VITALS — BP 120/68 | HR 51 | Temp 98.3°F | Wt 163.0 lb

## 2024-04-05 DIAGNOSIS — R001 Bradycardia, unspecified: Secondary | ICD-10-CM

## 2024-04-05 DIAGNOSIS — M353 Polymyalgia rheumatica: Secondary | ICD-10-CM | POA: Insufficient documentation

## 2024-04-05 DIAGNOSIS — R509 Fever, unspecified: Secondary | ICD-10-CM | POA: Diagnosis not present

## 2024-04-05 NOTE — Progress Notes (Signed)
 Patient Active Problem List   Diagnosis Date Noted  . Fever 03/04/2024  . Weakness of both lower extremities 03/04/2024  . Essential hypertension 07/06/2020  . Coronary artery disease of native artery of native heart with stable angina pectoris (HCC) 04/26/2019  . Sinus bradycardia 02/18/2019  . Hypercholesterolemia 02/18/2019  . Hypothyroidism 02/18/2019  . Abnormal computed tomography angiography (CTA)   . Stable angina (HCC) 02/17/2019  . Pneumonia 05/15/2017  . IRON DEFICIENCY ANEMIA SECONDARY TO BLOOD LOSS 01/14/2010  . INTERNAL HEMORRHOIDS WITHOUT MENTION COMP 01/14/2010   Current Outpatient Medications on File Prior to Visit  Medication Sig Dispense Refill  . acetaminophen (TYLENOL) 500 MG tablet Take 500-1,000 mg by mouth every 8 (eight) hours as needed (FOR BACK PAIN).    Marland Kitchen amLODipine (NORVASC) 5 MG tablet Take 1 tablet (5 mg total) by mouth daily. 30 tablet 1  . aspirin 81 MG tablet Take 81 mg by mouth at bedtime.     Marland Kitchen atorvastatin (LIPITOR) 80 MG tablet Take 1 tablet (80 mg total) by mouth daily at 6 PM. 30 tablet 3  . glucose blood (ONETOUCH VERIO) test strip 1 each by Other route as needed.    . Lecithin 1200 MG CAPS Take 2,400 mg by mouth daily.     Marland Kitchen levothyroxine (SYNTHROID, LEVOTHROID) 75 MCG tablet Take 75 mcg by mouth daily before breakfast.    . metFORMIN (GLUCOPHAGE) 500 MG tablet Take 1 tablet (500 mg total) by mouth 2 (two) times daily with a meal. 60 tablet 1  . metoprolol succinate (TOPROL-XL) 25 MG 24 hr tablet Take 0.5 tablets (12.5 mg total) by mouth daily. 30 tablet 1  . nitroGLYCERIN (NITROSTAT) 0.4 MG SL tablet Place 1 tablet (0.4 mg total) under the tongue every 5 (five) minutes as needed for chest pain. 25 tablet 2  . Omega-3 Fatty Acids (FISH OIL PO) Take 2 capsules by mouth daily with breakfast.     . predniSONE (DELTASONE) 5 MG tablet Take by mouth.    . triamcinolone cream (KENALOG) 0.1 % Apply 1 Application topically 2 (two) times daily as  needed.    . ciprofloxacin (CIPRO) 500 MG tablet Take 500 mg by mouth 2 (two) times daily. (Patient not taking: Reported on 04/05/2024)     No current facility-administered medications on file prior to visit.   Subjective: Discussed the use of AI scribe software for clinical note transcription with the patient, who gave verbal consent to proceed.   89 year old with a history of arthritis, hypothyroidism, BPH, migrane, Kidney stones, HLD, DM2, CAD who is referred from PCP for evaluation of fever of unknown origin.   Patient is accompanied by wife and daughter. Reports intermittent fevers with occasional chills that have been occurring since January. He had some back pain before fevers started dec last year. The fevers fluctuate, with the highest recorded temperature being 101.37F. Takes tylenol at home.  Initially, he had to take Tylenol every six hours, but the frequency has since decreased. Denies night sweats.   In addition to the fevers, the patient has been experiencing weakness in his bilateral legs, which has affected his mobility. He reports difficulty walking and a loss of strength in his lower body. The weakness is more pronounced in the mornings and has been gradually improving.  The patient also reports frequent urination, which has been a long-standing issue but has worsened over the past two months. He denies any burning sensation during urination, blood in urine, suprapubic pain but  reports a strong urge to urinate.  The patient has also noticed a loss of appetite and a slight weight loss. He reports a loss of taste, particularly for sweet foods. He denies any nausea, vomiting, diarrhea, abdominal pain, headache, blurry vision, neck pain, joint pain, or back pain.  The patient's wife notes that the patient has been short of breath and has lost his core strength. Denies cough or chest pain.   He found 3 ticks in his body last summer which he pulled out and put in a bottle. Did not  seek any medical attention.   He has completed 10 days of ciprofloxacin then 2 courses of 10 days doxycycline and a course of prednisone prescribed by PCP since the onset of fevers.   Per patient report,  CT head, chest abdomen pelvis as well as MRI Lumbar spine was done with no significant findings ( able to see results of MRI L spine in outside records but no CT head, CAP).  Denies smoking, alcohol and recreational drugs. Denies being sexually active. Denies pets at home. Denies recent travel, sick contact, TB contacts. Denies unusual food intake/unpasteurized food.  Denies family h/o autoimmune diseases.  4/15 Accompanied with Wife. Saw Rheumatology Dr Dierdre Forth who diagnosed him with PMR and started prednisone 20mg  po daily and planned for prolonged course. Fevers have resolved, weakness in legs have improved, appetite is better but thinks around 4 lbs weight loss after clinic visit with Dr Dierdre Forth. Feels like itching sensation in the rt side chest where he had shingles before, no rashes. Overall doing well. No complaints.   Review of Systems: all systems reviewed with pertinent positives and negatives as listed above  Past Medical History:  Diagnosis Date  . Abnormal PSA   . Bundle branch block, right   . Cataract   . Colon polyps    adenomatous  . Elevated prostate specific antigen (PSA)   . Erectile dysfunction   . Hypothyroidism   . Impaired glucose tolerance   . Migraine   . Nephrolithiasis    Past Surgical History:  Procedure Laterality Date  . cataract surgery    . EYE SURGERY  2008  . LEFT HEART CATH AND CORONARY ANGIOGRAPHY N/A 02/18/2019   Procedure: LEFT HEART CATH AND CORONARY ANGIOGRAPHY;  Surgeon: Lennette Bihari, MD;  Location: [facility];  Service: Cardiovascular;  Laterality: N/A;  . REFRACTIVE SURGERY     01/13/2013    Social History   Tobacco Use  . Smoking status: Never  . Smokeless tobacco: Never  Substance Use Topics  . Alcohol use: Yes     Comment: wine occasionally  . Drug use: No    Family History  Problem Relation Age of Onset  . Muscular dystrophy Father   . Ulcers Father   . Benign prostatic hyperplasia Brother   . Prostate cancer Brother   . CAD Brother   . Peripheral vascular disease Brother   . Colon cancer Neg Hx   . Esophageal cancer Neg Hx   . Rectal cancer Neg Hx   . Stomach cancer Neg Hx     No Known Allergies  Health Maintenance  Topic Date Due  . Medicare Annual Wellness (AWV)  Never done  . DTaP/Tdap/Td (1 - Tdap) Never done  . Pneumonia Vaccine 52+ Years old (1 of 2 - PCV) Never done  . Colonoscopy  01/29/2016  . COVID-19 Vaccine (3 - 2024-25 season) 08/23/2023  . INFLUENZA VACCINE  07/22/2024  . Zoster Vaccines-  Shingrix  Completed  . HPV VACCINES  Aged Out  . Meningococcal B Vaccine  Aged Out    Objective:  Vitals:   04/05/24 1035  BP: 120/68  Pulse: (!) 51  Temp: 98.3 F (36.8 C)  TempSrc: Oral  SpO2: 97%  Weight: 163 lb (73.9 kg)   Body mass index is 22.73 kg/m.  Physical Exam Constitutional:      Appearance: Normal appearance. No lymphadenopathy HENT:     Head: Normocephalic and atraumatic.      Mouth: Mucous membranes are moist.  Eyes:    Conjunctiva/sclera: Conjunctivae normal.     Pupils: Pupils are equal, round, and b/l symmetrical   Cardiovascular:     Rate and Rhythm: Normal rate and regular rhythm.     Heart sounds: s1s2  Pulmonary:     Effort: Pulmonary effort is normal.     Breath sounds: Normal breath sounds.   Abdominal:     General: Non distended     Palpations: soft.   Musculoskeletal:        General: Normal range of motion. Ambulatory  Skin:    General: Skin is warm and dry.     Comments:  Neurological:     General: grossly non focal     Mental Status: awake, alert and oriented to person, place, and time.   Psychiatric:        Mood and Affect: Mood normal.   Lab Results Lab Results  Component Value Date   WBC 10.4 03/03/2024    HGB 11.8 (L) 03/03/2024   HCT 36.0 (L) 03/03/2024   MCV 93.8 03/03/2024   PLT 372 03/03/2024    Lab Results  Component Value Date   CREATININE 0.75 03/03/2024   CREATININE 0.75 03/03/2024   BUN 16 03/03/2024   BUN 16 03/03/2024   NA 135 03/03/2024   NA 135 03/03/2024   K 4.4 03/03/2024   K 4.4 03/03/2024   CL 99 03/03/2024   CL 99 03/03/2024   CO2 24 03/03/2024   CO2 24 03/03/2024    Lab Results  Component Value Date   ALT 32 03/03/2024   ALT 32 03/03/2024   AST 27 03/03/2024   AST 27 03/03/2024   ALKPHOS 63 02/19/2019   BILITOT 0.5 03/03/2024   BILITOT 0.5 03/03/2024    Lab Results  Component Value Date   CHOL 117 02/18/2019   HDL 39 (L) 02/18/2019   LDLCALC 59 02/18/2019   TRIG 95 02/18/2019   CHOLHDL 3.0 02/18/2019   No results found for: "LABRPR", "RPRTITER" No results found for: "HIV1RNAQUANT", "HIV1RNAVL", "CD4TABS"   Assessment/Plan # Fevers of unknown origin - I reviewed patients fever diary ( see media) where mostly temperatures are in the range 98-72F with some recorded at 101.2, 100F, 100.1. Wife thinks his normal temperature is 73F and anything 99 or above is fever for him. - He has no known immunocompromising conditions.  - Labs as below to start to look for infectious cause No orders of the defined types were placed in this encounter. - Discussed other possible causes of fevers could be auto-immune causes as well as malignant causes ( given his age) and should fu with PCP for those - Fu in 3 weeks pending labs   # Lower extremity weakness/lumbar spinal stenosis - MRI L spine 01/27/24 01/27/24 multilevel lumbar spondylosis, moderate bilateral foraminal stenosis, no evidence of discitis/osteomyelitis - Fu with PCP  I have personally spent 75 involved in face-to-face and non-face-to-face activities for this  patient on the day of the visit. Professional time spent includes the following activities: Preparing to see the patient (review of tests), Obtaining  and/or reviewing separately obtained history (admission/discharge record), Performing a medically appropriate examination and/or evaluation , Ordering medications/tests/procedures, referring and communicating with other health care professionals, Documenting clinical information in the EMR, Independently interpreting results (not separately reported), Communicating results to the patient/family/caregiver, Counseling and educating the patient/family/caregiver and Care coordination (not separately reported).   Of note, portions of this note may have been created with voice recognition software. While this note has been edited for accuracy, occasional wrong-word or 'sound-a-like' substitutions may have occurred due to the inherent limitations of voice recognition software.   Melvina Stage, MD [facility] 04/05/2024, 10:50 AM

## 2024-04-06 ENCOUNTER — Encounter: Payer: Self-pay | Admitting: *Deleted

## 2024-04-07 NOTE — Addendum Note (Signed)
 Addended by: Terre Ferri on: 04/07/2024 04:46 PM   Modules accepted: Orders

## 2024-05-27 ENCOUNTER — Ambulatory Visit: Payer: Medicare Other | Attending: Cardiovascular Disease | Admitting: Cardiovascular Disease

## 2024-05-27 ENCOUNTER — Encounter: Payer: Self-pay | Admitting: Cardiovascular Disease

## 2024-05-27 VITALS — BP 104/56 | HR 55 | Ht 71.0 in | Wt 160.0 lb

## 2024-05-27 DIAGNOSIS — I10 Essential (primary) hypertension: Secondary | ICD-10-CM | POA: Diagnosis not present

## 2024-05-27 DIAGNOSIS — I25118 Atherosclerotic heart disease of native coronary artery with other forms of angina pectoris: Secondary | ICD-10-CM

## 2024-05-27 DIAGNOSIS — E119 Type 2 diabetes mellitus without complications: Secondary | ICD-10-CM | POA: Diagnosis not present

## 2024-05-27 DIAGNOSIS — E7800 Pure hypercholesterolemia, unspecified: Secondary | ICD-10-CM | POA: Diagnosis not present

## 2024-05-27 DIAGNOSIS — M353 Polymyalgia rheumatica: Secondary | ICD-10-CM

## 2024-05-27 NOTE — Patient Instructions (Signed)
 Medication Instructions:  - Take metoprolol  succinate (TOPROL -XL) 25 MG 24 hr tablet 12.5 mg by mouth daily    *If you need a refill on your cardiac medications before your next appointment, please call your pharmacy*   Lab Work: NONE    If you have labs (blood work) drawn today and your tests are completely normal, you will receive your results only by: MyChart Message (if you have MyChart) OR A paper copy in the mail If you have any lab test that is abnormal or we need to change your treatment, we will call you to review the results.   Testing/Procedures: NONE    Follow-Up: At [facility], you and your health needs are our priority.  As part of our continuing mission to provide you with exceptional heart care, we have created designated Provider Care Teams.  These Care Teams include your primary Cardiologist (physician) and Advanced Practice Providers (APPs -  Physician Assistants and Nurse Practitioners) who all work together to provide you with the care you need, when you need it.  We recommend signing up for the patient portal called "MyChart".  Sign up information is provided on this After Visit Summary.  MyChart is used to connect with patients for Virtual Visits (Telemedicine).  Patients are able to view lab/test results, encounter notes, upcoming appointments, etc.  Non-urgent messages can be sent to your provider as well.   To learn more about what you can do with MyChart, go to [email or web address].    Your next appointment:   1 year(s)  The format for your next appointment:   In Person  Provider:   Luana Rumple, MD   Other Instructions   DR Alvis Ba WOULD LIKE YOU TO CHECK YOUR BLOOD PRESSURE DAILY FOR 1 WEEK AND SEND THE READINGS TO US .

## 2024-05-27 NOTE — Progress Notes (Signed)
 Cardiology Office Note    Date:  05/27/2024   ID:  Jeremy Cortez, DOB Dec 30, 1934, MRN [id number]  PCP:  Lonzie Robins, MD  Cardiologist:  Luana Rumple, MD  Electrophysiologist:  None   Evaluation Performed:  Follow-Up Visit  Chief Complaint: PMR  History of Present Illness:    Jeremy Cortez is a 89 y.o. male with coronary artery disease.  (angiography February 2020 with 80-90% stenoses in relatively small branches of a large first diagonal artery; scattered 30-50% stenoses in other major coronary arteries), normal left ventricular systolic function and no symptoms of congestive heart failure.  He was doing very well until last December when he developed extreme leg weakness, preventing him from even walking to the mailbox.  The diagnosis was elusive for a while but he was eventually diagnosed with polymyalgia rheumatica and has had substantial improvement on treatment with prednisone, now under the supervision of Dr. Ebbie Goldmann.  He has lost quite a bit of weight, roughly 15 to 20 pounds, his muscles are much weaker and his blood sugar is now poorly controlled with a hemoglobin A1c of 9%.  Nevertheless, he is getting better and managed to play golf this weekend for the first time in months.  He has not had any angina pectoris (in the past he had some symptoms of "reflux" that might have represented angina and).  He denies shortness of breath at rest or with activity, being limited primarily by leg muscle weakness.  He has not had focal neurological deficits, orthopnea, PND, palpitations, dizziness or syncope.  He has occasional swelling of the ankles towards the end of the day if he has been sitting for a long period of time.  His wife points out that he has been a little "foggy" in his thinking recently.  He has been taking a whole metoprolol  succinate 25 mg tablet daily rather than the prescribed 12.5 mg daily.  His blood pressure today is borderline low.  Past Medical History:   Diagnosis Date   Abnormal PSA    Bundle branch block, right    Cataract    Colon polyps    adenomatous   Elevated prostate specific antigen (PSA)    Erectile dysfunction    Hypothyroidism    Impaired glucose tolerance    Migraine    Nephrolithiasis    Past Surgical History:  Procedure Laterality Date   cataract surgery     EYE SURGERY  2008   LEFT HEART CATH AND CORONARY ANGIOGRAPHY N/A 02/18/2019   Procedure: LEFT HEART CATH AND CORONARY ANGIOGRAPHY;  Surgeon: Millicent Ally, MD;  Location: [facility];  Service: Cardiovascular;  Laterality: N/A;   REFRACTIVE SURGERY     01/13/2013     Current Meds  Medication Sig   amLODipine  (NORVASC ) 5 MG tablet Take 1 tablet (5 mg total) by mouth daily.   amoxicillin-clavulanate (AUGMENTIN) 500-125 MG tablet Take 1 tablet by mouth 3 (three) times daily.   aspirin  81 MG tablet Take 81 mg by mouth at bedtime.    atorvastatin  (LIPITOR ) 80 MG tablet Take 1 tablet (80 mg total) by mouth daily at 6 PM.   BD PEN NEEDLE NANO 2ND GEN 32G X 4 MM MISC daily.   Continuous Glucose Sensor (FREESTYLE LIBRE 3 PLUS SENSOR) MISC once a week.   Insulin Aspart (NOVOLOG FLEXPEN [place]) Inject 3 mLs into the skin 2 (two) times daily with a meal.   Lecithin 1200 MG CAPS Take 2,400 mg by mouth daily.  levothyroxine  (SYNTHROID , LEVOTHROID) 75 MCG tablet Take 75 mcg by mouth daily before breakfast.   metFORMIN  (GLUCOPHAGE -XR) 500 MG 24 hr tablet Take 500 mg by mouth every evening.   metoprolol  succinate (TOPROL -XL) 25 MG 24 hr tablet Take 0.5 tablets (12.5 mg total) by mouth daily.   Omega-3 Fatty Acids (FISH OIL PO) Take 2 capsules by mouth daily with breakfast.    predniSONE (DELTASONE) 5 MG tablet Take 10 mg by mouth daily with breakfast.   sulfamethoxazole-trimethoprim (BACTRIM DS) 800-160 MG tablet Take 1 tablet by mouth 2 (two) times daily.   TRESIBA FLEXTOUCH 200 UNIT/ML FlexTouch Pen Inject 12 Units into the skin daily.     Allergies:   Patient has  no known allergies.   Social History   Tobacco Use   Smoking status: Never   Smokeless tobacco: Never  Substance Use Topics   Alcohol use: Yes    Comment: wine occasionally   Drug use: No     Family Hx: The patient's family history includes Benign prostatic hyperplasia in his brother; CAD in his brother; Muscular dystrophy in his father; Peripheral vascular disease in his brother; Prostate cancer in his brother; Ulcers in his father. There is no history of Colon cancer, Esophageal cancer, Rectal cancer, or Stomach cancer.  ROS:   Please see the history of present illness.    All other systems are reviewed and are negative.  Prior CV studies:   The following studies were reviewed today:  Coronary angiography February 18, 2019  Labs/Other Tests and Data Reviewed:    EKG: Personally reviewed ECG from 01/29/2024 which shows sinus bradycardia, old RBBB  Recent Labs: 03/03/2024: ALT 32; ALT 32; BUN 16; BUN 16; Creat 0.75; Creat 0.75; Hemoglobin 11.8; Platelets 372; Potassium 4.4; Potassium 4.4; Sodium 135; Sodium 135  CRP was markedly elevated at 68.9, but ESR was only 28 ANA +1: 320  Recent Lipid Panel Lab Results  Component Value Date/Time   CHOL 117 02/18/2019 03:53 AM   TRIG 95 02/18/2019 03:53 AM   HDL 39 (L) 02/18/2019 03:53 AM   CHOLHDL 3.0 02/18/2019 03:53 AM   LDLCALC 59 02/18/2019 03:53 AM  05/06/2024 Cholesterol 118, HDL 52, LDL 56, triglycerides 49 Hemoglobin A1c 9%  Wt Readings from Last 3 Encounters:  05/27/24 72.6 kg  04/05/24 73.9 kg  03/03/24 78.5 kg    Objective:    Vital Signs:  BP (!) 104/56 (BP Location: Left Arm, Patient Position: Sitting, Cuff Size: Normal)   Pulse (!) 55   Ht 5\' 11"  (1.803 m)   Wt 72.6 kg   SpO2 96%   BMI 22.32 kg/m      General: Alert, oriented x3, no distress, very lean Head: no evidence of trauma, PERRL, EOMI, no exophtalmos or lid lag, no myxedema, no xanthelasma; normal ears, nose and oropharynx Neck: normal  jugular venous pulsations and no hepatojugular reflux; brisk carotid pulses without delay and no carotid bruits Chest: clear to auscultation, no signs of consolidation by percussion or palpation, normal fremitus, symmetrical and full respiratory excursions Cardiovascular: normal position and quality of the apical impulse, regular rhythm, normal first and second heart sounds, no murmurs, rubs or gallops Abdomen: no tenderness or distention, no masses by palpation, no abnormal pulsatility or arterial bruits, normal bowel sounds, no hepatosplenomegaly Extremities: no clubbing, cyanosis or edema; 2+ radial, ulnar and brachial pulses bilaterally; 2+ right femoral, posterior tibial and dorsalis pedis pulses; 2+ left femoral, posterior tibial and dorsalis pedis pulses; no subclavian or femoral bruits  Neurological: grossly nonfocal Psych: Normal mood and affect    ASSESSMENT & PLAN:    1. Coronary artery disease of native artery of native heart with stable angina pectoris (HCC)   2. Hypercholesterolemia   3. Essential hypertension   4. Type 2 diabetes mellitus without complication, without long-term current use of insulin (HCC)   5. PMR (polymyalgia rheumatica) (HCC)        CAD: Asymptomatic on 2 antianginal medications (beta-blocker and calcium  channel blocker).  Heart rate and blood pressure are both rather low.  Decrease the metoprolol  to 12.5 mg once daily.  Asked him to send me a blood pressure and heart rate log in about a week. HLP: All lipid parameters are well within target range. HTN: Excessively well-controlled. DM: Glycemic control has deteriorated to due to steroid therapy.  He is weaning down the steroids now. PMR: Marked improvement on prednisone, but now dealing with the side effects of steroids.  He is due to decreased to 7.5 mg daily in the near future.  Patient Instructions  Medication Instructions:  - Take metoprolol  succinate (TOPROL -XL) 25 MG 24 hr tablet 12.5 mg by mouth  daily    *If you need a refill on your cardiac medications before your next appointment, please call your pharmacy*   Lab Work: NONE    If you have labs (blood work) drawn today and your tests are completely normal, you will receive your results only by: MyChart Message (if you have MyChart) OR A paper copy in the mail If you have any lab test that is abnormal or we need to change your treatment, we will call you to review the results.   Testing/Procedures: NONE    Follow-Up: At [facility], you and your health needs are our priority.  As part of our continuing mission to provide you with exceptional heart care, we have created designated Provider Care Teams.  These Care Teams include your primary Cardiologist (physician) and Advanced Practice Providers (APPs -  Physician Assistants and Nurse Practitioners) who all work together to provide you with the care you need, when you need it.  We recommend signing up for the patient portal called "MyChart".  Sign up information is provided on this After Visit Summary.  MyChart is used to connect with patients for Virtual Visits (Telemedicine).  Patients are able to view lab/test results, encounter notes, upcoming appointments, etc.  Non-urgent messages can be sent to your provider as well.   To learn more about what you can do with MyChart, go to [email or web address].    Your next appointment:   1 year(s)  The format for your next appointment:   In Person  Provider:   Luana Rumple, MD   Other Instructions   DR Alvis Ba WOULD LIKE YOU TO CHECK YOUR BLOOD PRESSURE DAILY FOR 1 WEEK AND SEND THE READINGS TO US .   Signed, Luana Rumple, MD  05/27/2024 11:02 AM    [facility]

## 2024-06-14 ENCOUNTER — Telehealth: Payer: Self-pay | Admitting: Cardiovascular Disease

## 2024-06-14 NOTE — Telephone Encounter (Signed)
 Paper Work Dropped Off: BP Readings    Date: 06/14/24  Location of paper:  Provider Mailbox

## 2024-06-20 NOTE — Telephone Encounter (Signed)
 Left message stating that Dr Francyne has reviewed his BP log and said that they look good and no changes are needed at this time.
